# Patient Record
Sex: Male | Born: 1975
Health system: Southern US, Community
[De-identification: ages and names within clinical notes are randomized; demographics above are authoritative.]

## PROBLEM LIST (undated history)

## (undated) DIAGNOSIS — B1910 Unspecified viral hepatitis B without hepatic coma: Secondary | ICD-10-CM

## (undated) DIAGNOSIS — F419 Anxiety disorder, unspecified: Secondary | ICD-10-CM

## (undated) DIAGNOSIS — F32A Depression, unspecified: Secondary | ICD-10-CM

## (undated) HISTORY — DX: Unspecified viral hepatitis B without hepatic coma: B19.10

## (undated) HISTORY — DX: Anxiety disorder, unspecified: F41.9

## (undated) HISTORY — PX: NO PAST SURGERIES: SHX2092

## (undated) HISTORY — DX: Depression, unspecified: F32.A

---

## 2003-08-12 ENCOUNTER — Emergency Department (HOSPITAL_COMMUNITY): Admission: EM | Admit: 2003-08-12 | Discharge: 2003-08-12 | Payer: Self-pay | Admitting: Emergency Medicine

## 2004-06-02 ENCOUNTER — Ambulatory Visit: Payer: Self-pay | Admitting: Internal Medicine

## 2004-06-16 ENCOUNTER — Ambulatory Visit: Payer: Self-pay | Admitting: Internal Medicine

## 2004-07-08 ENCOUNTER — Ambulatory Visit: Payer: Self-pay | Admitting: Internal Medicine

## 2004-08-07 ENCOUNTER — Ambulatory Visit: Payer: Self-pay | Admitting: Internal Medicine

## 2004-08-18 ENCOUNTER — Ambulatory Visit: Payer: Self-pay | Admitting: Internal Medicine

## 2006-04-01 ENCOUNTER — Emergency Department (HOSPITAL_COMMUNITY): Admission: EM | Admit: 2006-04-01 | Discharge: 2006-04-02 | Payer: Self-pay | Admitting: Emergency Medicine

## 2008-08-22 ENCOUNTER — Emergency Department (HOSPITAL_COMMUNITY): Admission: EM | Admit: 2008-08-22 | Discharge: 2008-08-22 | Payer: Self-pay | Admitting: Emergency Medicine

## 2009-02-18 ENCOUNTER — Ambulatory Visit: Payer: Self-pay | Admitting: Physician Assistant

## 2009-02-18 DIAGNOSIS — R634 Abnormal weight loss: Secondary | ICD-10-CM | POA: Insufficient documentation

## 2009-02-18 LAB — CONVERTED CEMR LAB
Amphetamine Screen, Ur: NEGATIVE
Bilirubin Urine: NEGATIVE
Cocaine Metabolites: NEGATIVE
Creatinine,U: 22.9 mg/dL
Glucose, Urine, Semiquant: NEGATIVE
Ketones, urine, test strip: NEGATIVE
Opiate Screen, Urine: NEGATIVE
Phencyclidine (PCP): NEGATIVE
Protein, U semiquant: NEGATIVE
Specific Gravity, Urine: 1.01
Urobilinogen, UA: 0.2

## 2009-02-21 ENCOUNTER — Encounter: Payer: Self-pay | Admitting: Physician Assistant

## 2009-02-21 LAB — CONVERTED CEMR LAB
ALT: 13 units/L (ref 0–53)
AST: 16 units/L (ref 0–37)
Alkaline Phosphatase: 73 units/L (ref 39–117)
Basophils Absolute: 0 10*3/uL (ref 0.0–0.1)
Basophils Relative: 0 % (ref 0–1)
HCV Ab: NEGATIVE
Hep A Total Ab: NEGATIVE
Hep B Core Total Ab: POSITIVE — AB
Hep B E Ab: POSITIVE — AB
Lymphocytes Relative: 20 % (ref 12–46)
MCHC: 32.1 g/dL (ref 30.0–36.0)
Neutro Abs: 4.5 10*3/uL (ref 1.7–7.7)
Neutrophils Relative %: 65 % (ref 43–77)
RBC: 5.15 M/uL (ref 4.22–5.81)
RDW: 13.8 % (ref 11.5–15.5)
Sodium: 142 meq/L (ref 135–145)
Total Bilirubin: 0.6 mg/dL (ref 0.3–1.2)
Total Protein: 7.6 g/dL (ref 6.0–8.3)

## 2009-05-11 ENCOUNTER — Emergency Department (HOSPITAL_COMMUNITY): Admission: EM | Admit: 2009-05-11 | Discharge: 2009-05-11 | Payer: Self-pay | Admitting: Emergency Medicine

## 2009-05-16 ENCOUNTER — Ambulatory Visit (HOSPITAL_COMMUNITY): Admission: RE | Admit: 2009-05-16 | Discharge: 2009-05-16 | Payer: Self-pay | Admitting: Physician Assistant

## 2009-05-16 ENCOUNTER — Encounter: Payer: Self-pay | Admitting: Physician Assistant

## 2010-04-29 ENCOUNTER — Emergency Department (HOSPITAL_COMMUNITY)
Admission: EM | Admit: 2010-04-29 | Discharge: 2010-04-29 | Payer: Self-pay | Source: Home / Self Care | Admitting: Emergency Medicine

## 2012-08-09 ENCOUNTER — Emergency Department (INDEPENDENT_AMBULATORY_CARE_PROVIDER_SITE_OTHER)
Admission: EM | Admit: 2012-08-09 | Discharge: 2012-08-09 | Disposition: A | Discharge status: Home / Self Care | Payer: Self-pay | Source: Home / Self Care | Attending: Emergency Medicine | Admitting: Emergency Medicine

## 2012-08-09 ENCOUNTER — Encounter (HOSPITAL_COMMUNITY): Payer: Self-pay | Admitting: *Deleted

## 2012-08-09 DIAGNOSIS — R51 Headache: Secondary | ICD-10-CM

## 2012-08-09 LAB — POCT I-STAT, CHEM 8
BUN: 14 mg/dL (ref 6–23)
Chloride: 102 mEq/L (ref 96–112)
Creatinine, Ser: 0.9 mg/dL (ref 0.50–1.35)
Glucose, Bld: 99 mg/dL (ref 70–99)
HCT: 43 % (ref 39.0–52.0)
Potassium: 3.7 mEq/L (ref 3.5–5.1)

## 2012-08-09 MED ORDER — ACETAMINOPHEN-CODEINE #3 300-30 MG PO TABS: 1.0000 | ORAL_TABLET | Freq: Four times a day (QID) | ORAL | Status: DC | PRN

## 2012-08-09 NOTE — ED Notes (Signed)
Pt reports that he had nausea, vomiting, diarrhea and headaches. States that all symptoms are resolved except for the headache. Pt appears to be in no acute distress.

## 2012-08-09 NOTE — ED Provider Notes (Signed)
History     CSN: [id number]  Arrival date & time 08/09/12  1509   First MD Initiated Contact with Patient 08/09/12 1530      Chief Complaint  Patient presents with  . Headache    (Consider location/radiation/quality/duration/timing/severity/associated sxs/prior treatment) HPI Comments: Patient presents urgent care reporting that for the last 3 days he's been having a " stomach bug", he reports he had nausea vomiting and several episodes diarrheas. Associated with headaches. Might have had some tactile fevers. At this point he is no longer vomiting or having any diarrheas since yesterday. Patient works at [employer] and describes that he he came in as his headache has not away completely. Patient denies any further symptoms such as visual changes, numbness tingling sensation or weakness of any upper lower extremities. Patient is ambulating fine. Have not really tried anything specifically for his headache so far.  Patient is a 37 y.o. male presenting with headaches. The history is provided by the patient.  Headache Pain location:  Generalized Quality:  Dull Radiates to:  Does not radiate Severity currently:  5/10 Onset quality:  Gradual Duration:  3 days Progression:  Waxing and waning Similar to prior headaches: no   Context: activity and loud noise   Associated symptoms: diarrhea, nausea and vomiting   Associated symptoms: no abdominal pain, no back pain, no blurred vision, no cough, no dizziness, no fever, no hearing loss, no neck pain, no numbness, no photophobia, no seizures, no sinus pressure and no tingling     History reviewed. No pertinent past medical history.  History reviewed. No pertinent past surgical history.  Family History  Problem Relation Age of Onset  . Family history unknown: Yes    History  Substance Use Topics  . Smoking status: Never Smoker   . Smokeless tobacco: Not on file  . Alcohol Use: No      Review of Systems  Constitutional: Positive for  appetite change. Negative for fever and activity change.  HENT: Negative for hearing loss, neck pain and sinus pressure.   Eyes: Negative for blurred vision and photophobia.  Respiratory: Negative for cough and shortness of breath.   Cardiovascular: Negative for chest pain and palpitations.  Gastrointestinal: Positive for nausea, vomiting and diarrhea. Negative for abdominal pain, constipation, blood in stool and anal bleeding.  Musculoskeletal: Negative for back pain.  Skin: Negative for color change, pallor and rash.  Neurological: Positive for headaches. Negative for dizziness, seizures, facial asymmetry, weakness and numbness.    Allergies  Review of patient's allergies indicates no known allergies.  Home Medications   Current Outpatient Rx  Name  Route  Sig  Dispense  Refill  . acetaminophen-codeine (TYLENOL #3) 300-30 MG per tablet   Oral   Take 1-2 tablets by mouth every 6 (six) hours as needed for pain.   15 tablet   0     BP 131/80  Pulse 76  Temp(Src) 98.2 F (36.8 C) (Oral)  Resp 18  SpO2 98%  Physical Exam  Nursing note and vitals reviewed. Constitutional: He is oriented to person, place, and time. Vital signs are normal. He appears well-developed and well-nourished.  Non-toxic appearance. He does not have a sickly appearance. He does not appear ill. No distress.  Eyes: Conjunctivae are normal. No scleral icterus.  Neck: Neck supple. No JVD present.  Cardiovascular: Normal rate.  Exam reveals no gallop and no friction rub.   No murmur heard. Pulmonary/Chest: Effort normal.  Neurological: He is alert and oriented to  person, place, and time. He has normal strength. He displays normal reflexes. No cranial nerve deficit or sensory deficit. He exhibits normal muscle tone. He displays a negative Romberg sign. Coordination and gait normal.  Skin: No rash noted. No erythema.    ED Course  Procedures (including critical care time)  Labs Reviewed  POCT I-STAT, CHEM  8   No results found.   1. Headache       MDM  Besides patient being underweight physical exam was unremarkable most specific a focus neurological exam. Patient is comfortable and smiling during exam. Have checked electrolytes no abnormalities. Encourage patient to take Tylenol No. 3 to drink abundant fluids and discussed symptoms that should warrant further evaluation in the emergency department otherwise have recommended that if his headache continues to followup with his primary care doctor in 3-5 days.        Jimmie Molly, MD 08/09/12 [phone]

## 2018-02-03 ENCOUNTER — Ambulatory Visit: Payer: Self-pay | Admitting: Family Medicine

## 2018-02-22 ENCOUNTER — Encounter: Payer: Self-pay | Admitting: Family Medicine

## 2018-02-22 ENCOUNTER — Ambulatory Visit: Payer: Self-pay | Attending: Family Medicine | Admitting: Family Medicine

## 2018-02-22 VITALS — BP 125/81 | HR 64 | Temp 98.3°F | Resp 18 | Ht 72.0 in | Wt 135.0 lb

## 2018-02-22 DIAGNOSIS — J329 Chronic sinusitis, unspecified: Secondary | ICD-10-CM | POA: Insufficient documentation

## 2018-02-22 DIAGNOSIS — B1910 Unspecified viral hepatitis B without hepatic coma: Secondary | ICD-10-CM

## 2018-02-22 DIAGNOSIS — H6123 Impacted cerumen, bilateral: Secondary | ICD-10-CM

## 2018-02-22 DIAGNOSIS — R634 Abnormal weight loss: Secondary | ICD-10-CM

## 2018-02-22 DIAGNOSIS — J019 Acute sinusitis, unspecified: Secondary | ICD-10-CM

## 2018-02-22 DIAGNOSIS — Z23 Encounter for immunization: Secondary | ICD-10-CM

## 2018-02-22 DIAGNOSIS — H9193 Unspecified hearing loss, bilateral: Secondary | ICD-10-CM

## 2018-02-22 MED ORDER — AMOXICILLIN 500 MG PO CAPS: 500.0000 mg | ORAL_CAPSULE | Freq: Two times a day (BID) | ORAL | 0 refills | Status: DC

## 2018-02-22 MED FILL — AMOXICILLIN 500 MG CAPSULE: 500 | 10 days supply | Qty: 20 | Fill #0

## 2018-02-22 NOTE — Patient Instructions (Signed)
Hepatitis B  Hepatitis B is a liver infection. It is caused by a germ. This germ is passed from person to person through:  · Blood.  · Birth.  · Sex.  · Body fluids, like breast milk, tears, and spit (saliva).    Follow these instructions at home:  · Rest when you feel tired.  · Avoid alcohol.  · Take medicines only as told by your doctor.  · Do not take any medicine unless your doctor says it is okay. This includes fever or pain medicine.  · Do not have sex unless your doctor says it is okay.  · Do not share toothbrushes, nail clippers, razors, or needles with others.  Get help right away if:  · You are not able to eat or drink.  · You have a fever and feel sick to your stomach (nauseous) or throw up (vomit).  · You feel confused.  · Your skin or the whites of your eyes look yellow (jaundice).  · You have trouble breathing.  · You get a rash.  · Your skin, throat, mouth, or face becomes puffy (swollen).  · You start twitching or shaking (seizure).  · You are very sleepy and have trouble waking up.  This information is not intended to replace advice given to you by your health care provider. Make sure you discuss any questions you have with your health care provider.  Document Released: 09/20/2008 Document Revised: 10/10/2015 Document Reviewed: 08/11/2013  Elsevier Interactive Patient Education © 2017 [employer]

## 2018-02-22 NOTE — Progress Notes (Signed)
Subjective:    Patient ID: Brent [name], male    DOB: [birth year], 42 y.o.   MRN: [id number]  HPI 42 year old male new to the practice.  Patient is accompanied by his aunt at today's visit.  Patient's aunt states that normally patient's father would be with the patient but his father was not feeling well today.  Aunt states that the patient is currently living with her as he is trying to find a job within walking distance of her house. Patient's aunt feels that the patient has been losing weight over the past 6 months despite having a good appetite. She also feels that patient does not always seem to hear her when she is talking with him. Aunt states that she lived and worked in [address] so she is not sure of all of patient's medical history but believes that she was told in the past that he had issues with his liver. Aunt has also noticed that patient at times recently has had a bad odor to his breath. Aunt states that patient never reports that he is having any issues.       Patient, upon questioning, denies any abdominal pain, no muscle or joint pain, no headaches or dizziness, no ear pain, and denies sore throat.  Patient denies any recent fever or chills, no shortness of breath or cough, no chest pain or palpitations.  Patient denies dysuria or urinary frequency.  Patient reports no drug allergies which is confirmed by his aunt.  Patient and aunt deny any past surgical history.  Per aunt, patient has a maternal great-grandmother who had a stroke at the age of 22 and a maternal great grandfather who had hypertension.  Patient does not smoke or drink alcohol.  Patient did graduate high school.  Patient is single and has no children.    Review of Systems  Constitutional: Negative for chills and fever.  HENT: Positive for hearing loss (aunt believes that patient does not hear as well as he should). Negative for ear pain and trouble swallowing.   Respiratory: Negative for cough and  shortness of breath.   Cardiovascular: Negative for chest pain and leg swelling.  Gastrointestinal: Negative for abdominal pain and constipation.  Genitourinary: Negative for dysuria and frequency.  Musculoskeletal: Negative for back pain, gait problem and joint swelling.  Neurological: Negative for dizziness and headaches.       Objective:   Physical Exam BP 125/81 (BP Location: Left Arm, Patient Position: Sitting, Cuff Size: Normal)   Pulse 64   Temp 98.3 F (36.8 C) (Oral)   Resp 18   Ht 6' (1.829 m)   Wt 135 lb (61.2 kg)   SpO2 96%   BMI 18.31 kg/m nurse's notes and vital signs reviewed General-well-nourished well-developed male who appears younger than stated age.  Patient is accompanied by his on at today's visit ENT- patient's TMs are obscured bilaterally by large amounts of soft appearing earwax.  Patient's earwax was removed by CMA via ear lavage.  Patient had complete removal of earwax from the left ear canal.  Patient's left TM was slightly pink but with visible landmarks.  Ear lavage had to be discontinued on the right ear secondary to patient with onset of dizziness.  Visualized portion of the TM was dull but patient still with significant amount of wax within the ear canal.  Patient with edema/erythema of the nasal turbinates and thick yellow nasal discharge, patient with mild posterior pharynx erythema Neck-[name], no lymphadenopathy, no thyromegaly  Cardiovascular- regular rate and rhythm Lungs-clear to auscultation bilaterally Abdomen-soft, nontender, patient did appear to have some fullness in the right upper quadrant with palpation but denied any discomfort Back-no CVA tenderness, patient did have some mild cervicothoracic scoliosis Extremities-no edema Psych- patient with a pleasant demeanor but did not really speak unless spoken to throughout his visit. Patient appears to have an intellectual disability     Assessment & Plan:  1. Subacute sinusitis, unspecified  location Patient with evidence of sinusitis on examination.  Patient is on has noted that patient has had a recent bad odor to his breath and I discussed with the aunt that this may be the source.  Prescription provided for amoxicillin 500 mg twice daily x10 days - amoxicillin (AMOXIL) 500 MG capsule; Take 1 capsule (500 mg total) by mouth 2 (two) times daily.  Dispense: 20 capsule; Refill: 0  2. Weight loss, unintentional Patient's aunt feels that despite having a good appetite, patient has had weight loss over the past 6 months.  On review of patient's chart, patient was seen at [facility] in 2010 for similar complaint and patient had hepatitis B panel which was positive.  Patient will have CMP and TSH done in follow-up of possible weight loss as well as GI referral. - Ambulatory referral to Gastroenterology - Comprehensive metabolic panel - TSH + free T4  3. Hepatitis B infection without delta agent without hepatic coma, unspecified chronicity On review of chart, patient has had a hepatitis B panel which was positive.  Patient will have CMP to check liver function test and will also be referred to GI for further evaluation and treatment - Ambulatory referral to Gastroenterology - Comprehensive metabolic panel  4. Bilateral impacted cerumen Patient with bilateral cerumen impaction on exam and CMA was able to remove all of the cerumen in the left ear via ear lavage and a portion of the wax in the right ear.  Patient did report improvement in hearing status post ear lavage.  Sample and coupon provided for Debrox for home use to help dissolve remaining wax.  5. Decreased hearing of both ears Patient's aunt had noted that patient seemed to have some difficulty with hearing and patient was found to have a bilateral cerumen impaction on exam and patient with improvement in hearing s/p removal of earwax.  6. Need for immunization against influenza Patient was offered and agreed to have influenza  immunization at today's visit  An After Visit Summary was printed and given to the patient.  Return in about 8 weeks (around 04/19/2018) for weight loss.

## 2018-02-23 LAB — COMPREHENSIVE METABOLIC PANEL WITH GFR
ALT: 19 IU/L (ref 0–44)
AST: 19 IU/L (ref 0–40)
Albumin/Globulin Ratio: 1.5 (ref 1.2–2.2)
Albumin: 4.6 g/dL (ref 3.5–5.5)
Alkaline Phosphatase: 77 IU/L (ref 39–117)
BUN/Creatinine Ratio: 21 — ABNORMAL HIGH (ref 9–20)
BUN: 18 mg/dL (ref 6–24)
Bilirubin Total: 0.4 mg/dL (ref 0.0–1.2)
CO2: 22 mmol/L (ref 20–29)
Calcium: 9.5 mg/dL (ref 8.7–10.2)
Chloride: 101 mmol/L (ref 96–106)
Creatinine, Ser: 0.85 mg/dL (ref 0.76–1.27)
GFR calc Af Amer: 126 mL/min/1.73
GFR calc non Af Amer: 109 mL/min/1.73
Globulin, Total: 3 g/dL (ref 1.5–4.5)
Glucose: 94 mg/dL (ref 65–99)
Potassium: 4.1 mmol/L (ref 3.5–5.2)
Sodium: 141 mmol/L (ref 134–144)
Total Protein: 7.6 g/dL (ref 6.0–8.5)

## 2018-02-23 LAB — TSH+FREE T4
Free T4: 1.34 ng/dL (ref 0.82–1.77)
TSH: 1.24 u[IU]/mL (ref 0.450–4.500)

## 2018-02-25 ENCOUNTER — Telehealth: Payer: Self-pay

## 2018-02-25 NOTE — Telephone Encounter (Signed)
Patient returned the call for his lab results. please follow up with patient.

## 2018-02-25 NOTE — Telephone Encounter (Signed)
Patient was called, aunt answered the phone and stated she would relay message to return call. If the patient returns the call  Please notify patient of normal CMP and normal thyroid blood work

## 2018-02-25 NOTE — Telephone Encounter (Signed)
-----   Message from Cain Saupe, MD sent at 02/25/2018  2:01 PM EDT ----- Please notify patient of normal CMP and normal thyroid blood work

## 2018-03-02 ENCOUNTER — Encounter: Payer: Self-pay | Admitting: Gastroenterology

## 2018-03-31 ENCOUNTER — Encounter: Payer: Self-pay | Admitting: Gastroenterology

## 2018-03-31 ENCOUNTER — Other Ambulatory Visit (INDEPENDENT_AMBULATORY_CARE_PROVIDER_SITE_OTHER): Payer: Self-pay

## 2018-03-31 ENCOUNTER — Telehealth: Payer: Self-pay

## 2018-03-31 ENCOUNTER — Ambulatory Visit (INDEPENDENT_AMBULATORY_CARE_PROVIDER_SITE_OTHER): Payer: Self-pay | Admitting: Gastroenterology

## 2018-03-31 ENCOUNTER — Encounter (INDEPENDENT_AMBULATORY_CARE_PROVIDER_SITE_OTHER): Payer: Self-pay

## 2018-03-31 VITALS — BP 110/80 | HR 74 | Ht 72.0 in | Wt 139.0 lb

## 2018-03-31 DIAGNOSIS — K225 Diverticulum of esophagus, acquired: Secondary | ICD-10-CM

## 2018-03-31 DIAGNOSIS — R196 Halitosis: Secondary | ICD-10-CM

## 2018-03-31 DIAGNOSIS — R634 Abnormal weight loss: Secondary | ICD-10-CM

## 2018-03-31 DIAGNOSIS — K219 Gastro-esophageal reflux disease without esophagitis: Secondary | ICD-10-CM

## 2018-03-31 LAB — H. PYLORI ANTIBODY, IGG: H PYLORI IGG: NEGATIVE

## 2018-03-31 LAB — TSH: TSH: 0.88 u[IU]/mL (ref 0.35–4.50)

## 2018-03-31 MED ORDER — PANTOPRAZOLE SODIUM 40 MG PO TBEC: 40.0000 mg | DELAYED_RELEASE_TABLET | Freq: Two times a day (BID) | ORAL | 1 refills | Status: DC

## 2018-03-31 NOTE — Patient Instructions (Addendum)
Your provider has requested that you go to the basement level for lab work before leaving today. Press "B" on the elevator. The lab is located at the first door on the left as you exit the elevator.   We have sent the following medications to your pharmacy for you to pick up at your convenience: Do not start the Pantoprazole until you have turned in the stool specimen  Follow up appointment with Dr. Tarri Glenn on 04/19/18 11:00  You have been scheduled for an Upper GI Series and Small Bowel Follow Thru at 04/15/18. Your appointment is on 04/15/18 at 11:00. Please arrive 15 minutes prior to your test for registration. Make certain not to have anything to eat or drink after midnight on the night before your test. If you need to reschedule, please contact radiology at [phone]. --------------------------------------------------------------------------------------------------------------- An upper GI series uses x rays to help diagnose problems of the upper GI tract, which includes the esophagus, stomach, and duodenum. The duodenum is the first part of the small intestine. An upper GI series is conducted by a radiology technologist or a radiologist-a doctor who specializes in x-ray imaging-at a hospital or outpatient center. While sitting or standing in front of an x-ray machine, the patient drinks barium liquid, which is often white and has a chalky consistency and taste. The barium liquid coats the lining of the upper GI tract and makes signs of disease show up more clearly on x rays. X-ray video, called fluoroscopy, is used to view the barium liquid moving through the esophagus, stomach, and duodenum. Additional x rays and fluoroscopy are performed while the patient lies on an x-ray table. To fully coat the upper GI tract with barium liquid, the technologist or radiologist may press on the abdomen or ask the patient to change position. Patients hold still in various positions, allowing the technologist or  radiologist to take x rays of the upper GI tract at different angles. If a technologist conducts the upper GI series, a radiologist will later examine the images to look for problems.  This test typically takes about 1 hour to complete --------------------------------------------------------------------------------------------------------------------------------------------- The Small Bowel Follow Thru examination is used to visualize the entire small bowel (intestines); specifically the connection between the small and large intestine. You will be positioned on a flat x-ray table and an image of your abdomen taken. Then the technologist will show the x-ray to the radiologist. The radiologist will instruct your technologist how much (1-2 cups) barium sulfate you will drink and when to begin taking the timed x-rays, usually 15-30 minutes after you begin drinking. Barium is a harmless substance that will highlight your small intestine by absorbing x-ray. The taste is chalky and it feels very heavy both in the cup and in your stomach.  After the first x-ray is taken and shown to the radiologist, he/she will determine when the next image is to be taken. This is repeated until the barium has reached the end of the small intestine and enters the beginning of the colon (cecum). At such time when the barium spills into the colon, you will be positioned on the x-ray table once again. The radiologist will use a fluoroscopic camera to take some detailed pictures of the connection between your small intestine and colon. The fluoroscope is an x-ray unit that works with a television/computer screen. The radiologist will apply pressure to your abdomen with his/her hand and a lead glove, a plastic paddle, or a paddle with an inflated rubber balloon on the end.  This is to spread apart your loops of intestine so he/she can see all areas.   This test typically takes around 1 hour to complete.  Important Drink plenty of water  (8-10 cups/day) for a few days following the procedure to avoid constipation and blockage. The barium will make your stools white for a few days. --------------------------------------------------------------------------------------------------------------------------------------------   You have been scheduled for a CT scan of the abdomen and pelvis at [place] are scheduled on 04/05/18 at 2:30 pm. You should arrive 15 minutes prior to your appointment time for registration. Please follow the written instructions below on the day of your exam:  WARNING: IF YOU ARE ALLERGIC TO IODINE/X-RAY DYE, PLEASE NOTIFY RADIOLOGY IMMEDIATELY AT [phone]! YOU WILL BE GIVEN A 13 HOUR PREMEDICATION PREP.  1) Do not eat or drink anything after 2:30 (4 hours prior to your test) 2) You have been given 2 bottles of oral contrast to drink. The solution may taste better if refrigerated, but do NOT add ice or any other liquid to this solution. Shake well before drinking.    Drink 1 bottle of contrast @ 12:30 pm (2 hours prior to your exam)  Drink 1 bottle of contrast @ 1:30 pm (1 hour prior to your exam)  You may take any medications as prescribed with a small amount of water, if necessary. If you take any of the following medications: METFORMIN, GLUCOPHAGE, GLUCOVANCE, AVANDAMET, RIOMET, FORTAMET, [place] MET, JANUMET, GLUMETZA or METAGLIP, you MAY be asked to HOLD this medication 48 hours AFTER the exam.  The purpose of you drinking the oral contrast is to aid in the visualization of your intestinal tract. The contrast solution may cause some diarrhea. Depending on your individual set of symptoms, you may also receive an intravenous injection of x-ray contrast/dye. Plan on being at [facility] for 30 minutes or longer, depending on the type of exam you are having performed.  This test typically takes 30-45 minutes to complete.  If you have any questions regarding your exam or if you  need to reschedule, you may call the CT department at [phone] between the hours of 8:00 am and 5:00 pm, Monday-Friday.  ________________________________________________________________________          If you are age 83 or older, your body mass index should be between 23-30. Your Body mass index is 18.85 kg/m. If this is out of the aforementioned range listed, please consider follow up with your Primary Care Provider.  If you are age 4 or younger, your body mass index should be between 19-25. Your Body mass index is 18.85 kg/m. If this is out of the aformentioned range listed, please consider follow up with your Primary Care Provider.

## 2018-03-31 NOTE — Telephone Encounter (Signed)
Left message for patient to call back for results.  

## 2018-03-31 NOTE — Progress Notes (Signed)
Referring Provider: Cain Saupe, MD Primary Care Physician:  Cain Saupe, MD   Reason for Consultation:  Weight loss, hepatitis B   IMPRESSION:  Unintentional weight loss Halitosis Past HBV infection    - HBcAb+. HBsAb+. HBeAb+. HBsAg-.     - HCV Ab negative Platelets of 174,000  Extent of weight loss is unclear. Will work to obtain some objective evidence. He is completely asymptomatic and is, himself, not bothered by the halitosis.   Gastrointestinal causes of halitosis include Zenker's diverticulum, H pylori, gastroesophageal reflux disease and the rare diagnosis of gastrocolic fistulae.  Given this differential I am recommending an upper GI series. However, non-GI etiologies should also be considered. I encouraged he and his aunt to follow-up closely with Dr. Jillyn Hidden.   I have reviewed his Hepatitis B labs from 2010. They show resolved infection. No additional lab evaluation necessary at this time.  PLAN:  UGI series to evaluate for GERD and Zenker's diverticulum CT chest/abd/pelvis with contrast to evaluate for weight loss H pylori stool antibody test TSH Consider repeating a CBC if this was not done by Dr. Jillyn Hidden. Pantoprazole 40 mg BID x 8 weeks Obtain records from [employer] regarding prior weights Return to the clinic 1-2 weeks after the CT scan  HPI: Brent [name] is a 42 y.o. male seen in consultation at the request of Dr. Jillyn Hidden for further evaluation of weight loss and hepatitis B.  The history is obtained through the patient, his aunt who accompanies him to this appointment, and review of his electronic health record. Recently moved in with his aunt after his grandparents who had been his caregivers. Previously followed a health serve.  Referred for weight loss. Good appetite. Eating well. He has needed to get smaller pants. Unable to identify the time frame of weight loss.  Unable to quantitate his weight loss.  There is no dysphagia, odynophagia, regurgitation,   heartburn, nausea, abdominal pain, change in bowel habits, melena, hematochezia, or bright red blood per rectum. There is no anorexia.  His aunt accompanies him to this appointment and is worried about halitosis. She is concerned that he has some undiagnosed health issues because he doesn't appear well. She notes that he was diagnosed with hepatitis B in 2005. She believes he is extremely skinny compared to his baseline. She notes that he eats well and is worried that his weight continues to decline.   Labs from 02/18/2009 show a hep B core antibody positive, hep B surface antibody positive, and hep B E antibody positive.  Hep B surface antigen was negative.  HCV antibody was negative   A comprehensive metabolic panel 02/22/2018 was normal.  CBC was normal 02/18/2009 with a white count 6.9, hemoglobin 14.8, platelets 174.     No past medical history on file.  No past surgical history on file.  Current Outpatient Medications  Medication Sig Dispense Refill  . amoxicillin (AMOXIL) 500 MG capsule Take 1 capsule (500 mg total) by mouth 2 (two) times daily. 20 capsule 0   No current facility-administered medications for this visit.     Allergies as of 03/31/2018  . (No Known Allergies)    Family History  Family history unknown: Yes    Social History   Socioeconomic History  . Marital status: Single    Spouse name: Not on file  . Number of children: Not on file  . Years of education: Not on file  . Highest education level: Not on file  Occupational History  .  Not on file  Social Needs  . Financial resource strain: Not on file  . Food insecurity:    Worry: Not on file    Inability: Not on file  . Transportation needs:    Medical: Not on file    Non-medical: Not on file  Tobacco Use  . Smoking status: Never Smoker  . Smokeless tobacco: Never Used  Substance and Sexual Activity  . Alcohol use: No  . Drug use: No  . Sexual activity: Never  Lifestyle  . Physical activity:     Days per week: Not on file    Minutes per session: Not on file  . Stress: Not on file  Relationships  . Social connections:    Talks on phone: Not on file    Gets together: Not on file    Attends religious service: Not on file    Active member of club or organization: Not on file    Attends meetings of clubs or organizations: Not on file    Relationship status: Not on file  . Intimate partner violence:    Fear of current or ex partner: Not on file    Emotionally abused: Not on file    Physically abused: Not on file    Forced sexual activity: Not on file  Other Topics Concern  . Not on file  Social History Narrative  . Not on file    Review of Systems: 12 system ROS is negative except as noted above.  There were no vitals filed for this visit.  Physical Exam: Vital signs were reviewed. General:   Alert, well-nourished, pleasant and cooperative in NAD Head:  Normocephalic and atraumatic. Eyes:  Sclera clear, no icterus.   Conjunctiva pink. Mouth:  No deformity or lesions.   Neck:  Supple; no thyromegaly. Lungs:  Clear throughout to auscultation.   No wheezes.  Heart:  Regular rate and rhythm; no murmurs Abdomen:  Soft, nontender, normal bowel sounds. No rebound or guarding. No hepatosplenomegaly Rectal:  Deferred  Msk:  Symmetrical without gross deformities. Extremities:  No gross deformities or edema. Neurologic:  Alert and  oriented x4;  grossly nonfocal Skin:  No rash or bruise. Psych:  Alert and cooperative. Normal mood and affect.   Heath Tesler L. Orvan FalconerBeavers, Md, MPH [facility] 03/31/2018, 8:10 AM

## 2018-04-05 ENCOUNTER — Ambulatory Visit (HOSPITAL_COMMUNITY)
Admission: RE | Admit: 2018-04-05 | Discharge: 2018-04-05 | Disposition: A | Discharge status: Home / Self Care | Payer: Self-pay | Source: Ambulatory Visit | Attending: Gastroenterology | Admitting: Gastroenterology

## 2018-04-05 DIAGNOSIS — R634 Abnormal weight loss: Secondary | ICD-10-CM | POA: Insufficient documentation

## 2018-04-05 MED ORDER — SODIUM CHLORIDE (PF) 0.9 % IJ SOLN
INTRAMUSCULAR | Status: AC
  Filled 2018-04-05: qty 50

## 2018-04-05 MED ORDER — IOHEXOL 300 MG/ML  SOLN
100.0000 mL | Freq: Once | INTRAMUSCULAR | Status: AC | PRN
  Administered 2018-04-05: 100 mL via INTRAVENOUS

## 2018-04-05 MED ORDER — IOPAMIDOL (ISOVUE-300) INJECTION 61%: 100.0000 mL | Freq: Once | INTRAVENOUS | Status: DC | PRN

## 2018-04-08 ENCOUNTER — Telehealth: Payer: Self-pay | Admitting: Gastroenterology

## 2018-04-08 NOTE — Telephone Encounter (Signed)
Patient family member calling nurse back to get results from CT done on 11.19.19.

## 2018-04-08 NOTE — Telephone Encounter (Signed)
Patient's aunt advised of results and recommendations. She will pass this information onto him.

## 2018-04-15 ENCOUNTER — Inpatient Hospital Stay (HOSPITAL_COMMUNITY): Admission: RE | Admit: 2018-04-15 | Payer: Self-pay | Source: Ambulatory Visit

## 2018-04-18 NOTE — Progress Notes (Deleted)
Referring Provider: Cain Saupe, MD Primary Care Physician:  Cain Saupe, MD   Reason for Consultation:  ***   IMPRESSION:  Unintentional weight loss    - no source of identified on CT of chest/abd/pelvis    - TSH normal Halitosis    - H pylori negative Past HBV infection    - HBcAb+. HBsAb+. HBeAb+. HBsAg-.     - HCV Ab negative Platelets of 174,000  Extent of weight loss is unclear. Will work to obtain some objective evidence. He is completely asymptomatic and is, himself, not bothered by the halitosis.   Gastrointestinal causes of halitosis include Zenker's diverticulum, H pylori, gastroesophageal reflux disease and the rare diagnosis of gastrocolic fistulae.  Given this differential I am recommending an upper GI series. However, non-GI etiologies should also be considered. I encouraged he and his aunt to follow-up closely with Dr. Jillyn Hidden.   I have reviewed his Hepatitis B labs from 2010. They show resolved infection. No additional lab evaluation necessary at this time.  PLAN: UGI series as previously planned   HPI: Brent [name] is a 42 y.o. male returns in follow-up after his initial consultation for unintentional weight loss and halitosis.  He is prescribed Protonix 40 mg twice daily at that time.  A CT of the abdomen and pelvis with contrast 04/05/2018 suggested a collapsed hiatal hernia versus thickening of the distal esophagus.  There is a 6 mm liver cyst.  There is a significant stool burden throughout the colon.  No significant intrathoracic, intra-abdominal, or intrapelvic abnormalities.  Labs showed a normal TSH.  Testing for H. pylori was negative.    Past Medical History:  Diagnosis Date  . Anxiety   . Hepatitis B     Past Surgical History:  Procedure Laterality Date  . NO PAST SURGERIES      Current Outpatient Medications  Medication Sig Dispense Refill  . pantoprazole (PROTONIX) 40 MG tablet Take 1 tablet (40 mg total) by mouth 2 (two) times  daily. 60 tablet 1   No current facility-administered medications for this visit.     Allergies as of 04/19/2018  . (No Known Allergies)    Family History  Problem Relation Age of Onset  . Ovarian cancer Paternal Grandmother   . Prostate cancer Paternal Grandfather     Social History   Socioeconomic History  . Marital status: Single    Spouse name: Not on file  . Number of children: 0  . Years of education: Not on file  . Highest education level: Not on file  Occupational History  . Occupation: unemployed  Social Needs  . Financial resource strain: Not on file  . Food insecurity:    Worry: Not on file    Inability: Not on file  . Transportation needs:    Medical: Not on file    Non-medical: Not on file  Tobacco Use  . Smoking status: Never Smoker  . Smokeless tobacco: Never Used  Substance and Sexual Activity  . Alcohol use: No  . Drug use: No  . Sexual activity: Never  Lifestyle  . Physical activity:    Days per week: Not on file    Minutes per session: Not on file  . Stress: Not on file  Relationships  . Social connections:    Talks on phone: Not on file    Gets together: Not on file    Attends religious service: Not on file    Active member of club or organization: Not  on file    Attends meetings of clubs or organizations: Not on file    Relationship status: Not on file  . Intimate partner violence:    Fear of current or ex partner: Not on file    Emotionally abused: Not on file    Physically abused: Not on file    Forced sexual activity: Not on file  Other Topics Concern  . Not on file  Social History Narrative  . Not on file    Review of Systems: 12 system ROS is negative except as noted above.  There were no vitals filed for this visit.  Physical Exam: Vital signs were reviewed. Vital signs were reviewed. General:   Alert, well-nourished, pleasant and cooperative in NAD Head:  Normocephalic and atraumatic. Eyes:  Sclera clear, no icterus.    Conjunctiva pink. Mouth:  No deformity or lesions.   Neck:  Supple; no thyromegaly. Lungs:  Clear throughout to auscultation.   No wheezes.  Heart:  Regular rate and rhythm; no murmurs Abdomen:  Soft, nontender, normal bowel sounds. No rebound or guarding. No hepatosplenomegaly Rectal:  Deferred  Msk:  Symmetrical without gross deformities. Extremities:  No gross deformities or edema. Neurologic:  Alert and  oriented x4;  grossly nonfocal Skin:  No rash or bruise. Psych:  Alert and cooperative. Normal mood and affect.  Ragena Fiola L. Orvan FalconerBeavers, MD, MPH [facility] 04/18/2018, 5:04 PM

## 2018-04-19 ENCOUNTER — Ambulatory Visit: Payer: Self-pay | Admitting: Gastroenterology

## 2018-04-19 ENCOUNTER — Ambulatory Visit: Payer: Self-pay | Admitting: Family Medicine

## 2018-04-21 ENCOUNTER — Ambulatory Visit (HOSPITAL_COMMUNITY): Payer: Self-pay

## 2018-12-07 ENCOUNTER — Other Ambulatory Visit: Payer: Self-pay

## 2018-12-07 DIAGNOSIS — Z20822 Contact with and (suspected) exposure to covid-19: Secondary | ICD-10-CM

## 2018-12-09 LAB — NOVEL CORONAVIRUS, NAA: SARS-CoV-2, NAA: NOT DETECTED

## 2019-01-11 ENCOUNTER — Other Ambulatory Visit: Payer: Self-pay

## 2019-01-11 DIAGNOSIS — Z20822 Contact with and (suspected) exposure to covid-19: Secondary | ICD-10-CM

## 2019-01-12 LAB — NOVEL CORONAVIRUS, NAA: SARS-CoV-2, NAA: NOT DETECTED

## 2019-03-08 ENCOUNTER — Ambulatory Visit: Payer: Self-pay

## 2019-03-23 ENCOUNTER — Other Ambulatory Visit: Payer: Self-pay

## 2019-03-23 ENCOUNTER — Ambulatory Visit: Payer: Self-pay | Admitting: Family Medicine

## 2019-04-07 ENCOUNTER — Other Ambulatory Visit: Payer: Self-pay

## 2019-04-07 DIAGNOSIS — Z20822 Contact with and (suspected) exposure to covid-19: Secondary | ICD-10-CM

## 2019-04-10 LAB — NOVEL CORONAVIRUS, NAA: SARS-CoV-2, NAA: NOT DETECTED

## 2019-04-27 ENCOUNTER — Ambulatory Visit: Payer: Self-pay | Attending: Family Medicine | Admitting: Family Medicine

## 2019-04-27 ENCOUNTER — Encounter: Payer: Self-pay | Admitting: Family Medicine

## 2019-04-27 ENCOUNTER — Other Ambulatory Visit: Payer: Self-pay

## 2019-04-27 VITALS — BP 133/90 | HR 87 | Temp 98.5°F | Ht 72.0 in | Wt 139.6 lb

## 2019-04-27 DIAGNOSIS — Z8042 Family history of malignant neoplasm of prostate: Secondary | ICD-10-CM

## 2019-04-27 DIAGNOSIS — L659 Nonscarring hair loss, unspecified: Secondary | ICD-10-CM

## 2019-04-27 DIAGNOSIS — R634 Abnormal weight loss: Secondary | ICD-10-CM

## 2019-04-27 LAB — POCT URINALYSIS DIPSTICK
Bilirubin, UA: NEGATIVE
Blood, UA: NEGATIVE
Glucose, UA: NEGATIVE
Ketones, UA: NEGATIVE
Leukocytes, UA: NEGATIVE
Nitrite, UA: NEGATIVE
Protein, UA: NEGATIVE
Spec Grav, UA: 1.015
Urobilinogen, UA: 2 U/dL — AB
pH, UA: 7.5

## 2019-04-27 LAB — POCT GLYCOSYLATED HEMOGLOBIN (HGB A1C): Hemoglobin A1C: 5.6 % (ref 4.0–5.6)

## 2019-04-27 LAB — GLUCOSE, POCT (MANUAL RESULT ENTRY): POC Glucose: 90 mg/dL (ref 70–99)

## 2019-04-27 NOTE — Patient Instructions (Signed)
Alopecia Areata, Adult  Alopecia areata is a condition that causes you to lose hair. You may lose hair on your scalp in patches. In some cases, you may lose all the hair on your scalp (alopecia totalis) or all the hair from your face and body (alopecia universalis). Alopecia areata is an autoimmune disease. This means that your body's defense system (immune system) mistakes normal parts of the body for germs or other things that can make you sick. When you have alopecia areata, the immune system attacks the hair follicles. Alopecia areata usually develops in childhood, but it can develop at any age. For some people, their hair grows back on its own and hair loss does not happen again. For others, their hair may fall out and grow back in cycles. The hair loss may last many years. Having this condition can be emotionally difficult, but it is not dangerous. What are the causes? The cause of this condition is not known. What increases the risk? This condition is more likely to develop in people who have:  A family history of alopecia.  A family history of another autoimmune disease, including type 1 diabetes and rheumatoid arthritis.  Asthma and allergies.  Down syndrome. What are the signs or symptoms? Round spots of patchy hair loss on the scalp is the main symptom of this condition. The spots may be mildly itchy. Other symptoms include:  Short dark hairs in the bald patches that are wider at the top (exclamation point hairs).  Dents, white spots, or lines in the fingernails or toenails.  Balding and body hair loss. This is rare. How is this diagnosed? This condition is diagnosed based on your symptoms and family history. Your health care provider will also check your scalp skin, teeth, and nails. Your health care provider may refer you to a specialist in hair and skin disorders (dermatologist). You may also have tests, including:  A hair pull test.  Blood tests or other screening tests  to check for autoimmune diseases, such as thyroid disease or diabetes.  Skin biopsy to confirm the diagnosis.  A procedure to examine the skin with a lighted magnifying instrument (dermoscopy). How is this treated? There is no cure for alopecia areata. Treatment is aimed at promoting the regrowth of hair and preventing the immune system from overreacting. No single treatment is right for all people with alopecia areata. It depends on the type of hair loss you have and how severe it is. Work with your health care provider to find the best treatment for you. Treatment may include:  Having regular checkups to make sure the condition is not getting worse (watchful waiting).  Steroid creams or pills for 6-8 weeks to stop the immune reaction and help hair to regrow more quickly.  Other topical medicines to alter the immune system response and support the hair growth cycle.  Steroid injections.  Therapy and counseling with a support group or therapist if you are having trouble coping with hair loss. Follow these instructions at home:  Learn as much as you can about your condition.  Apply topical creams only as told by your health care provider.  Take over-the-counter and prescription medicines only as told by your health care provider.  Consider getting a wig or products to make hair look fuller or to cover bald spots, if you feel uncomfortable with your appearance.  Get therapy or counseling if you are having a hard time coping with hair loss. Ask your health care provider to recommend   a counselor or support group.  Keep all follow-up visits as told by your health care provider. This is important. Contact a health care provider if:  Your hair loss gets worse, even with treatment.  You have new symptoms.  You are struggling emotionally. Summary  Alopecia areata is an autoimmune condition that makes your body's defense system (immune system) attack the hair follicles. This causes you  to lose hair.  Treatments may include regular checkups to make sure that the condition is not getting worse (watchful waiting), medicines, and steroid injections. This information is not intended to replace advice given to you by your health care provider. Make sure you discuss any questions you have with your health care provider. Document Released: 12/07/2003 Document Revised: 04/16/2017 Document Reviewed: 05/22/2016 Elsevier Patient Education  2020 Elsevier Inc.  

## 2019-04-27 NOTE — Progress Notes (Signed)
Pt. Is here to have PCP to look at his alopecia.  Pt. Stated he has been walking a lot, and thinks he lost weight.

## 2019-04-27 NOTE — Progress Notes (Signed)
Established Patient Office Visit  Subjective:  Patient ID: Brent [name], male    DOB: 28-Oct-1975  Age: 42 y.o. MRN: [id number]  CC:  Chief Complaint  Patient presents with  . Follow-up    HPI Brent [name], 43 year old male, who was last seen in the office on 02/22/2018, due to the complaint of abnormal weight loss, abnormal labs regarding Hep B and complaint of a bad odor to his breath for which he was referred to gastroenterology for further evaluation and treatment as well as treated for subacute sinusitis. He reports that he did follow-up with GI. He is no longer taking any medications.          He still feels as if he loses weight without changes in his diet. He does admit that he tends to walk a lot on a daily basis as he does not have other transportation. He denies any abdominal pain, no N/V/D/C and no loss of appetite. He has not noticed any recent issues with a bad odor to his breath. He feels well overall. He has noticed an area of hair loss about the size of a nickel on the left side of the head. He has not had any rash on this area of his scalp. His is able to cover the area with surrounding hair. He does not pull at his hair or rub the area of hair loss. He just happened to notice it one day. .   Past Medical History:  Diagnosis Date  . Anxiety   . Hepatitis B     Past Surgical History:  Procedure Laterality Date  . NO PAST SURGERIES      Family History  Problem Relation Age of Onset  . Ovarian cancer Paternal Grandmother   . Prostate cancer Paternal Grandfather     Social History   Socioeconomic History  . Marital status: Single    Spouse name: Not on file  . Number of children: 0  . Years of education: Not on file  . Highest education level: Not on file  Occupational History  . Occupation: unemployed  Tobacco Use  . Smoking status: Never Smoker  . Smokeless tobacco: Never Used  Substance and Sexual Activity  . Alcohol use: Yes  . Drug use: No  .  Sexual activity: Never  Other Topics Concern  . Not on file  Social History Narrative  . Not on file   Social Determinants of Health   Financial Resource Strain:   . Difficulty of Paying Living Expenses: Not on file  Food Insecurity:   . Worried About Charity fundraiser in the Last Year: Not on file  . Ran Out of Food in the Last Year: Not on file  Transportation Needs:   . Lack of Transportation (Medical): Not on file  . Lack of Transportation (Non-Medical): Not on file  Physical Activity:   . Days of Exercise per Week: Not on file  . Minutes of Exercise per Session: Not on file  Stress:   . Feeling of Stress : Not on file  Social Connections:   . Frequency of Communication with Friends and Family: Not on file  . Frequency of Social Gatherings with Friends and Family: Not on file  . Attends Religious Services: Not on file  . Active Member of Clubs or Organizations: Not on file  . Attends Archivist Meetings: Not on file  . Marital Status: Not on file  Intimate Partner Violence:   . Fear of  Current or Ex-Partner: Not on file  . Emotionally Abused: Not on file  . Physically Abused: Not on file  . Sexually Abused: Not on file    Outpatient Medications Prior to Visit  Medication Sig Dispense Refill  . pantoprazole (PROTONIX) 40 MG tablet Take 1 tablet (40 mg total) by mouth 2 (two) times daily. (Patient not taking: Reported on 04/27/2019) 60 tablet 1   No facility-administered medications prior to visit.    No Known Allergies  ROS Review of Systems  Constitutional: Positive for fatigue and unexpected weight change. Negative for diaphoresis and fever.  HENT: Negative for ear pain, nosebleeds, sore throat and trouble swallowing.   Eyes: Negative for photophobia and visual disturbance.  Respiratory: Negative for cough and shortness of breath.   Cardiovascular: Negative for chest pain, palpitations and leg swelling.  Gastrointestinal: Negative for abdominal  pain, blood in stool, constipation, diarrhea and nausea.  Endocrine: Negative for cold intolerance, heat intolerance, polydipsia, polyphagia and polyuria.  Genitourinary: Negative for difficulty urinating, dysuria, flank pain, frequency, hematuria, scrotal swelling and testicular pain.  Musculoskeletal: Negative for arthralgias, back pain, gait problem, joint swelling, neck pain and neck stiffness.  Skin: Negative for rash and wound.  Neurological: Negative for dizziness and headaches.  Hematological: Negative for adenopathy. Does not bruise/bleed easily.  Psychiatric/Behavioral: Negative for self-injury and suicidal ideas. The patient is not nervous/anxious.       Objective:    Physical Exam  Constitutional: He is oriented to person, place, and time. He appears well-developed and well-nourished.  Well-nourished well-developed male in no acute distress and who does not appear to be acutely ill, who is wearing mask as per office COVID-19 precautions  Neck: No JVD present. No thyromegaly present.  Cardiovascular: Normal rate and regular rhythm.  Pulmonary/Chest: Effort normal and breath sounds normal.  Abdominal: Soft. There is no abdominal tenderness. There is no rebound and no guarding.  Musculoskeletal:        General: No tenderness or edema. Normal range of motion.     Cervical back: Normal range of motion and neck supple.  Lymphadenopathy:    He has no cervical adenopathy.  Neurological: He is alert and oriented to person, place, and time.  Skin: Skin is warm and dry.  Psychiatric: He has a normal mood and affect. His behavior is normal.  Nursing note and vitals reviewed.   BP 133/90 (BP Location: Right Arm, Patient Position: Sitting, Cuff Size: Normal)   Pulse 87   Temp 98.5 F (36.9 C) (Oral)   Ht 6' (1.829 m)   Wt 139 lb 9.6 oz (63.3 kg)   SpO2 98%   BMI 18.93 kg/m  Wt Readings from Last 3 Encounters:  04/27/19 139 lb 9.6 oz (63.3 kg)  03/31/18 139 lb (63 kg)    02/22/18 135 lb (61.2 kg)    Patient offered but declined HIV screening, tetanus immunization and influenza immunization at today's visit   Lab Results  Component Value Date   TSH 0.986 04/27/2019   Lab Results  Component Value Date   WBC 6.8 04/27/2019   HGB 14.2 04/27/2019   HCT 44.4 04/27/2019   MCV 88 04/27/2019   PLT 206 04/27/2019   Lab Results  Component Value Date   NA 141 02/22/2018   K 4.1 02/22/2018   CO2 22 02/22/2018   GLUCOSE 94 02/22/2018   BUN 18 02/22/2018   CREATININE 0.85 02/22/2018   BILITOT 0.4 02/22/2018   ALKPHOS 77 02/22/2018   AST 19  02/22/2018   ALT 19 02/22/2018   PROT 7.6 02/22/2018   ALBUMIN 4.6 02/22/2018   CALCIUM 9.5 02/22/2018   No results found for: CHOL No results found for: HDL No results found for: LDLCALC No results found for: TRIG No results found for: [facility] Lab Results  Component Value Date   HGBA1C 5.6 04/27/2019      Assessment & Plan:  1. Weight loss, unintentional Patient reports an intentional weight loss.  Will check glucose and hemoglobin A1c to look for possible prediabetes or diabetes as a cause of weight loss along with checking urinalysis to look for ketones or proteinuria, CBC to look for anemia or other blood disorder, T4 and TSH look for thyroid disorder and PSA to look for abnormality requiring further evaluation for prostate cancer.  Patient is encouraged to review his current diet and make sure that his diet is adequate for his nutritional needs.  He is encouraged not to skip meals and to try and eat an actual meal about every 8 hours as well as snack if needed between meals.  You may also use over-the-counter supplements such as Ensure.  He should weigh himself at least once per week and call or return if he is losing more than 2 pounds per week unintentionally.  Patient however reports that he has increased his level of activity, walking which may be contributing to his weight loss. On review of chart, his  weight has actually remained stable. Per GI note, he does not require any additional follow-up of labs indicating past Hepatitis B infection.  - Glucose (CBG) - HgB A1c - Urinalysis Dipstick - CBC - T4 AND TSH - PSA  2. Alopecia Educational handout provided regarding hair loss/alopecia.  Try over-the-counter supplement, biotin, as well as increasing dietary protein.  Will check TSH to look for thyroid abnormality and CBC to look for anemia as these things can contribute to hair loss however alopecia can also occur without unknown cause.  3. Family history of prostate cancer Will check PSA due to patient's age, ethnicity, complaint of weight loss and his family history of prostate cancer.  - PSA  An After Visit Summary was printed and given to the patient.  Follow-up: Return in about 3 months (around 07/26/2019). Monitor weight and return sooner if continued weight loss or any concerns.    Cain Saupe, MD

## 2019-04-28 LAB — CBC
Hematocrit: 44.4 % (ref 37.5–51.0)
Hemoglobin: 14.2 g/dL (ref 13.0–17.7)
MCH: 28.2 pg (ref 26.6–33.0)
MCHC: 32 g/dL (ref 31.5–35.7)
MCV: 88 fL (ref 79–97)
Platelets: 206 x10E3/uL (ref 150–450)
RBC: 5.04 x10E6/uL (ref 4.14–5.80)
RDW: 12.9 % (ref 11.6–15.4)
WBC: 6.8 x10E3/uL (ref 3.4–10.8)

## 2019-04-28 LAB — PSA: Prostate Specific Ag, Serum: 0.9 ng/mL (ref 0.0–4.0)

## 2019-04-28 LAB — T4 AND TSH
T4, Total: 7.5 ug/dL (ref 4.5–12.0)
TSH: 0.986 u[IU]/mL (ref 0.450–4.500)

## 2019-05-03 ENCOUNTER — Telehealth: Payer: Self-pay | Admitting: Family Medicine

## 2019-05-03 NOTE — Telephone Encounter (Signed)
LMOM

## 2019-05-03 NOTE — Telephone Encounter (Signed)
Patient aunt called requesting lab results. Please f/u

## 2019-05-04 ENCOUNTER — Encounter: Payer: Self-pay | Admitting: *Deleted

## 2019-05-21 ENCOUNTER — Encounter: Payer: Self-pay | Admitting: Family Medicine

## 2019-06-28 ENCOUNTER — Ambulatory Visit: Payer: Self-pay | Attending: Internal Medicine

## 2019-07-29 ENCOUNTER — Other Ambulatory Visit: Payer: Self-pay

## 2019-07-29 ENCOUNTER — Ambulatory Visit: Payer: Self-pay | Attending: Internal Medicine

## 2019-07-29 DIAGNOSIS — Z23 Encounter for immunization: Secondary | ICD-10-CM

## 2019-07-29 NOTE — Progress Notes (Signed)
   Covid-19 Vaccination Clinic  Name:  Brent [name]    MRN: [id number] DOB: November 24, 1975  07/29/2019  [name] was observed post Covid-19 immunization for 15 minutes without incident. He was provided with Vaccine Information Sheet and instruction to access the V-Safe system.   [name] was instructed to call 911 with any severe reactions post vaccine: Marland Kitchen Difficulty breathing  . Swelling of face and throat  . A fast heartbeat  . A bad rash all over body  . Dizziness and weakness   Immunizations Administered    Name Date Dose VIS Date Route   Pfizer COVID-19 Vaccine 07/29/2019 10:37 AM 0.3 mL 04/28/2019 Intramuscular   Manufacturer: [employer], [employer]   Lot: [id number]   NDC: [address]-0

## 2019-08-23 ENCOUNTER — Ambulatory Visit: Payer: Self-pay | Attending: Internal Medicine

## 2019-08-23 DIAGNOSIS — Z23 Encounter for immunization: Secondary | ICD-10-CM

## 2019-08-23 NOTE — Progress Notes (Signed)
   Covid-19 Vaccination Clinic  Name:  CURTEZ BRALLIER    MRN: [id number] DOB: [birth year]  08/23/2019  Mr. Harbeson was observed post Covid-19 immunization for 15 minutes without incident. He was provided with Vaccine Information Sheet and instruction to access the V-Safe system.   Mr. Enns was instructed to call 911 with any severe reactions post vaccine: Marland Kitchen Difficulty breathing  . Swelling of face and throat  . A fast heartbeat  . A bad rash all over body  . Dizziness and weakness   Immunizations Administered    Name Date Dose VIS Date Route   Pfizer COVID-19 Vaccine 08/23/2019  9:41 AM 0.3 mL 04/28/2019 Intramuscular   Manufacturer: [employer], [employer]   Lot: [id number]   NDC: [address]-5

## 2019-09-03 NOTE — Progress Notes (Signed)
Patient ID: Brent [name], male    DOB: [birth year]  MRN: [id number]  CC: Hearing problem   Subjective: Brent [name] is a 44 y.o. male with history of weight loss who presents for hearing problem.  1. HEARING PROBLEM: Patient presents today with his aunt, Ms. [name], who reports that she feels that her nephew may have a hearing problem. Reports the patient has been living with her for 8 years but since 4 months ago when she speaks to the patient he will not respond to her. She reports that she will repeat herself multiple times and he will not respond so she feels as if his hearing has been affected in some way. She also reports that the patient works at [employer] as a Nature conservation officer. States the patient's manager told her that the patient does not respond appropriately at work as well and will go without doing some of his required tasks. Aunt reports patient has problem with lying and that sometimes he does not go to work when he says he is and will lie about his whereabouts at any given time.   When patient asked why he does not respond to his aunt he states "I don't know" When patient asked why he does not respond to his manager he states "I do not want to be bothered." Patient states "Sometimes I'm forgetful." Patient reports that he is bored since the pandemic began and wants to go out and have fun. Patient says he likes to go to the mall and [employer] for fun. Aunt reports that she explained to the patient that because of the pandemic he is not able to go out and have fun as he once did.   Aunt reports that patient drinks alcohol when he isn't supposed to. When Aunt asked why patient is not supposed to drink alcohol she states "Because he is living in my house and I do not allow that." Patient then turns to Aunt and says "Well you do it." of which Aunt denies that she does. Aunt reports patient stole a flask of liquor from her and a bottle of wine. When patient asked why he did that he states "I  just thought it was something to drink." "I didn't know what it was." Aunt reports this is not true as he has done this on multiple occasions stealing alcohol from not only her but from family members as well. Reports he has stolen beer, Rum from [place], and mixed drinks. Aunt reports sometimes when patient drinks alcohol he has angry outbursts and uses profanity but has not been extremely violent.  Patient denies feelings of depression and anxiety, thoughts of suicide ideation, and self-harm.  Patient denies ear pain, discharge, trauma, sensation of fullness or foreign body in the ear, upper respiratory symptoms, toothache, fever, tinnitus, dizziness, headache, hearing loss, popping and clicking sounds, loss of balance, and vision change. Patient reports that he does not have a hearing problem and can hear well.   Patient Active Problem List   Diagnosis Date Noted  . WEIGHT LOSS 02/18/2009     Current Outpatient Medications on File Prior to Visit  Medication Sig Dispense Refill  . pantoprazole (PROTONIX) 40 MG tablet Take 1 tablet (40 mg total) by mouth 2 (two) times daily. (Patient not taking: Reported on 04/27/2019) 60 tablet 1   No current facility-administered medications on file prior to visit.    No Known Allergies  Social History   Socioeconomic History  . Marital status: Single  Spouse name: Not on file  . Number of children: 0  . Years of education: Not on file  . Highest education level: Not on file  Occupational History  . Occupation: unemployed  Tobacco Use  . Smoking status: Never Smoker  . Smokeless tobacco: Never Used  Substance and Sexual Activity  . Alcohol use: Yes  . Drug use: No  . Sexual activity: Never  Other Topics Concern  . Not on file  Social History Narrative  . Not on file   Social Determinants of Health   Financial Resource Strain:   . Difficulty of Paying Living Expenses:   Food Insecurity:   . Worried About Programme researcher, broadcasting/film/video in the  Last Year:   . Barista in the Last Year:   Transportation Needs:   . Freight forwarder (Medical):   Marland Kitchen Lack of Transportation (Non-Medical):   Physical Activity:   . Days of Exercise per Week:   . Minutes of Exercise per Session:   Stress:   . Feeling of Stress :   Social Connections:   . Frequency of Communication with Friends and Family:   . Frequency of Social Gatherings with Friends and Family:   . Attends Religious Services:   . Active Member of Clubs or Organizations:   . Attends Banker Meetings:   Marland Kitchen Marital Status:   Intimate Partner Violence:   . Fear of Current or Ex-Partner:   . Emotionally Abused:   Marland Kitchen Physically Abused:   . Sexually Abused:     Family History  Problem Relation Age of Onset  . Ovarian cancer Paternal Grandmother   . Prostate cancer Paternal Grandfather     Past Surgical History:  Procedure Laterality Date  . NO PAST SURGERIES      ROS: Review of Systems Negative except as stated above  PHYSICAL EXAM: Vitals with BMI 09/05/2019 04/27/2019 03/31/2018  Height 6\' 6"  6\' 0"  6\' 0"   Weight 143 lbs 8 oz 139 lbs 10 oz 139 lbs  BMI 16.59 18.93 18.85  Systolic 136 133  Diastolic 84 90 80  Pulse 79 87 74  SpO2- 99%, room air Temperature- 97.5 F, oral  Physical Exam  General appearance - alert, well appearing, and in no distress and oriented to person, place, and time Mental status - alert, oriented to person, place, and time, normal mood, behavior, speech, dress, motor activity, and thought processes Eyes - pupils equal and reactive, extraocular eye movements intact Ears - bilateral TM's and external ear canals normal Nose - normal and patent, no erythema, discharge or polyps and normal nontender sinuses Mouth - mucous membranes moist, pharynx normal without lesions Neck - supple, no significant adenopathy Lymphatics - no palpable lymphadenopathy, no hepatosplenomegaly Chest - clear to auscultation, no wheezes,  rales or rhonchi, symmetric air entry, no tachypnea, retractions or cyanosis Heart - normal rate, regular rhythm, normal S1, S2, no murmurs, rubs, clicks or gallops  ASSESSMENT AND PLAN: 1. Hearing within normal limits in both ears: -Patient history and physical examination supports patient has bilateral normal hearing. Patient denies all signs and symptoms of hearing loss and ear infection. Bilateral ear exam unremarkable.  -Patient reporting that he can hear well. Patient expresses that he does not respond to family and his coworkers when they speak to him because he does not want to be bothered.  -During history taking patient conversing with his Aunt and I without need need for repeating any statements or questions. Patient  is alert and oriented to person, place, time, and situation.  -It seems patient may have some concern about not being able to interact in society as he normally would because of pandemic restrictions over the course of the last year. Patient may benefit from a social work consultation. -Follow-up with primary physician as needed. - Ambulatory referral to Social Work   Patient was given the opportunity to ask questions.  Patient verbalized understanding of the plan and was able to repeat key elements of the plan. Patient was given clear instructions to go to Emergency Department or return to medical center if symptoms don't improve, worsen, or new problems develop.The patient verbalized understanding.   Requested Prescriptions    No prescriptions requested or ordered in this encounter    Brent Swanner Zachery Dauer, NP

## 2019-09-05 ENCOUNTER — Other Ambulatory Visit: Payer: Self-pay

## 2019-09-05 ENCOUNTER — Ambulatory Visit: Payer: Self-pay | Attending: Family | Admitting: Family

## 2019-09-05 ENCOUNTER — Encounter: Payer: Self-pay | Admitting: Family

## 2019-09-05 VITALS — BP 136/84 | HR 79 | Temp 97.5°F | Resp 16 | Ht 78.0 in | Wt 143.5 lb

## 2019-09-05 DIAGNOSIS — Z0110 Encounter for examination of ears and hearing without abnormal findings: Secondary | ICD-10-CM

## 2019-09-05 NOTE — Progress Notes (Signed)
Male acquaintance, patient's aunt states he is deep in thought, focus seems off, not responding to anything w/i the last 4 mos.   He is told to do something but it does not register.   Patient alert and oriented name, DOB, able to report where he works and his position he holds at his job. Most questions he answers appropriately other than why he came in to be seen today. He referenced to his aunt.

## 2019-09-05 NOTE — Patient Instructions (Signed)
Follow-up with primary physician as needed. Earache, Adult An earache, or ear pain, can be caused by many things, including:  An infection.  Ear wax buildup.  Ear pressure.  Something in the ear that should not be there (foreign body).  A sore throat.  Tooth problems.  Jaw problems. Treatment of the earache will depend on the cause. If the cause is not clear or cannot be determined, you may need to watch your symptoms until your earache goes away or until a cause is found. Follow these instructions at home: Medicines  Take or apply over-the-counter and prescription medicines only as told by your health care provider.  If you were prescribed an antibiotic medicine, use it as told by your health care provider. Do not stop using the antibiotic even if you start to feel better.  Do not put anything in your ear other than medicine that is prescribed by your health care provider. Managing pain If directed, apply heat to the affected area as often as told by your health care provider. Use the heat source that your health care provider recommends, such as a moist heat pack or a heating pad.  Place a towel between your skin and the heat source.  Leave the heat on for 20-30 minutes.  Remove the heat if your skin turns bright red. This is especially important if you are unable to feel pain, heat, or cold. You may have a greater risk of getting burned. If directed, put ice on the affected area as often as told by your health care provider. To do this:      Put ice in a plastic bag.  Place a towel between your skin and the bag.  Leave the ice on for 20 minutes, 2-3 times a day. General instructions  Pay attention to any changes in your symptoms.  Try resting in an upright position instead of lying down. This may help to reduce pressure in your ear and relieve pain.  Chew gum if it helps to relieve your ear pain.  Treat any allergies as told by your health care  provider.  Drink enough fluid to keep your urine pale yellow.  It is up to you to get the results of any tests that were done. Ask your health care provider, or the department that is doing the tests, when your results will be ready.  Keep all follow-up visits as told by your health care provider. This is important. Contact a health care provider if:  Your pain does not improve within 2 days.  Your earache gets worse.  You have new symptoms.  You have a fever. Get help right away if you:  Have a severe headache.  Have a stiff neck.  Have trouble swallowing.  Have redness or swelling behind your ear.  Have fluid or blood coming from your ear.  Have hearing loss.  Feel dizzy. Summary  An earache, or ear pain, can be caused by many things.  Treatment of the earache will depend on the cause. Follow recommendations from your health care provider to treat your ear pain.  If the cause is not clear or cannot be determined, you may need to watch your symptoms until your earache goes away or until a cause is found.  Keep all follow-up visits as told by your health care provider. This is important. This information is not intended to replace advice given to you by your health care provider. Make sure you discuss any questions you have with your  health care provider. Document Revised: 12/10/2018 Document Reviewed: 12/10/2018 Elsevier Patient Education  2020 [employer].

## 2019-11-16 ENCOUNTER — Telehealth: Payer: Self-pay | Admitting: Licensed Clinical Social Worker

## 2019-11-16 NOTE — Telephone Encounter (Signed)
Call placed to patient regarding IBH referral. LCSW left message requesting a return call.  

## 2019-12-01 ENCOUNTER — Telehealth: Payer: Self-pay | Admitting: Licensed Clinical Social Worker

## 2019-12-01 NOTE — Telephone Encounter (Signed)
Call placed regarding IBH referral. LCSW unable to leave message due to voicemail not being set up.

## 2019-12-04 ENCOUNTER — Institutional Professional Consult (permissible substitution): Payer: Self-pay | Admitting: Licensed Clinical Social Worker

## 2019-12-07 ENCOUNTER — Ambulatory Visit: Payer: Self-pay | Attending: Family Medicine | Admitting: Licensed Clinical Social Worker

## 2019-12-07 ENCOUNTER — Other Ambulatory Visit: Payer: Self-pay

## 2019-12-07 DIAGNOSIS — F4323 Adjustment disorder with mixed anxiety and depressed mood: Secondary | ICD-10-CM

## 2019-12-14 NOTE — BH Specialist Note (Signed)
Integrated Behavioral Health Initial Visit  MRN: [id number] Name: Brent [name]  Number of Integrated Behavioral Health Clinician visits:: 1/6 Session Start time: 8:40 AM  Session End time: 9:10 AM Total time: 30  Type of Service: Integrated Behavioral Health- Individual Interpretor:No. Interpretor Name and Language: NA   SUBJECTIVE: Brent [name] is a 44 y.o. male accompanied by Paternal Aunt Patient was referred by NP Zonia Kief for stress. Patient reports the following symptoms/concerns: Pt's aunt is concerned about pt's behavior and recent increase in irritability Duration of problem: Ongoing; Severity of problem: na  OBJECTIVE: Mood: Anxious and Affect: Appropriate Risk of harm to self or others: No plan to harm self or others  LIFE CONTEXT: Family and Social: Pt resides with aunt. Has additional family that resides locally School/Work: Pt is employed part time at [employer] Self-Care: Pt likes to spend time alone Life Changes: Pt and aunt appear to have communication challenges. Pt may be having difficulty managing stress  GOALS ADDRESSED: Patient will: 1. Reduce symptoms of: stress 2. Increase knowledge and/or ability of: coping skills and healthy habits  3. Demonstrate ability to: Increase healthy adjustment to current life circumstances and Increase adequate support systems for patient/family  INTERVENTIONS: Interventions utilized: Solution-Focused Strategies, Supportive Counseling and Psychoeducation and/or Health Education  Standardized Assessments completed: Not Needed  ASSESSMENT: Patient currently experiencing stress triggered by family. Pt's aunt is concerned about increase in irritability and low motivation.   Patient may benefit from supportive resources. LCSW discussed correlation between one's physical and mental health, in addition, to how stress can negatively impact both. Family provided consent for LCSW to complete referral to [facility] for a case  manager to provide additional support for patient.   PLAN: 1. Follow up with behavioral health clinician on : Contact LCSW with additional behavioral health and/or resource needs 2. Behavioral recommendations: Utilize healthy coping skills discussed 3. Referral(s): Integrated Behavioral Health Services (In Clinic) 4. "From scale of 1-10, how likely are you to follow plan?":   Bridgett Larsson, LCSW 12/14/2019 11:43 AM

## 2020-01-24 ENCOUNTER — Ambulatory Visit: Payer: Self-pay | Admitting: Physician Assistant

## 2020-02-09 ENCOUNTER — Ambulatory Visit: Payer: Self-pay | Attending: Physician Assistant | Admitting: Family Medicine

## 2020-02-09 ENCOUNTER — Other Ambulatory Visit: Payer: Self-pay

## 2020-02-09 ENCOUNTER — Encounter: Payer: Self-pay | Admitting: Family Medicine

## 2020-02-09 ENCOUNTER — Ambulatory Visit: Payer: Self-pay | Admitting: *Deleted

## 2020-02-09 VITALS — BP 133/79 | HR 79 | Resp 16 | Wt 141.6 lb

## 2020-02-09 DIAGNOSIS — R350 Frequency of micturition: Secondary | ICD-10-CM

## 2020-02-09 DIAGNOSIS — R3915 Urgency of urination: Secondary | ICD-10-CM

## 2020-02-09 DIAGNOSIS — Z23 Encounter for immunization: Secondary | ICD-10-CM

## 2020-02-09 LAB — POCT URINALYSIS DIP (CLINITEK)
Bilirubin, UA: NEGATIVE
Blood, UA: NEGATIVE
Glucose, UA: NEGATIVE mg/dL
Ketones, POC UA: NEGATIVE mg/dL
Leukocytes, UA: NEGATIVE
Nitrite, UA: NEGATIVE
POC PROTEIN,UA: NEGATIVE
Spec Grav, UA: 1.025
Urobilinogen, UA: 0.2 U/dL
pH, UA: 7

## 2020-02-09 NOTE — Progress Notes (Signed)
Established Patient Office Visit  Subjective:  Patient ID: Brent [name], male    DOB: 01-30-1976  Age: 44 y.o. MRN: [id number]  CC:  Chief Complaint  Patient presents with  . Anxiety   Patient denies current anxiety but reports issues with urinary urgency- C. Ragena Fiola, MD  HPI [name], 44 yo male who is seen secondary to the complaint of ongoing issues with urinary urgency and at times frequency of urination. He denies any dysuria- no pain or burning with urination. He denies any issues or concerns regarding sexually transmitted infections. He denies any increased thirst or blurred vision. He gets the sudden onset of the need to urinate. He denies any incontinence related to urgency. He denies any back pain, no abdominal or pelvic pain. He does not have issues with getting up at night to urinate and no weakening of the urinary stream. He does not have to strain to urinate. He wonders if his symptoms are related to his water intake.   Past Medical History:  Diagnosis Date  . Anxiety   . Hepatitis B     Past Surgical History:  Procedure Laterality Date  . NO PAST SURGERIES      Family History  Problem Relation Age of Onset  . Ovarian cancer Paternal Grandmother   . Prostate cancer Paternal Grandfather     Social History   Socioeconomic History  . Marital status: Single    Spouse name: Not on file  . Number of children: 0  . Years of education: Not on file  . Highest education level: Not on file  Occupational History  . Occupation: unemployed  Tobacco Use  . Smoking status: Never Smoker  . Smokeless tobacco: Never Used  Vaping Use  . Vaping Use: Never used  Substance and Sexual Activity  . Alcohol use: Yes  . Drug use: No  . Sexual activity: Never  Other Topics Concern  . Not on file  Social History Narrative  . Not on file   Social Determinants of Health   Financial Resource Strain:   . Difficulty of Paying Living Expenses: Not on file  Food  Insecurity:   . Worried About Programme researcher, broadcasting/film/video in the Last Year: Not on file  . Ran Out of Food in the Last Year: Not on file  Transportation Needs:   . Lack of Transportation (Medical): Not on file  . Lack of Transportation (Non-Medical): Not on file  Physical Activity:   . Days of Exercise per Week: Not on file  . Minutes of Exercise per Session: Not on file  Stress:   . Feeling of Stress : Not on file  Social Connections:   . Frequency of Communication with Friends and Family: Not on file  . Frequency of Social Gatherings with Friends and Family: Not on file  . Attends Religious Services: Not on file  . Active Member of Clubs or Organizations: Not on file  . Attends Banker Meetings: Not on file  . Marital Status: Not on file  Intimate Partner Violence:   . Fear of Current or Ex-Partner: Not on file  . Emotionally Abused: Not on file  . Physically Abused: Not on file  . Sexually Abused: Not on file    No outpatient medications prior to visit.   No facility-administered medications prior to visit.    No Known Allergies  ROS Review of Systems  Constitutional: Negative for chills, fatigue and fever.  HENT: Negative for sore throat  and trouble swallowing.   Respiratory: Negative for cough and shortness of breath.   Cardiovascular: Negative for chest pain, palpitations and leg swelling.  Gastrointestinal: Negative for abdominal pain, constipation, diarrhea and nausea.  Endocrine: Positive for polyuria. Negative for polydipsia and polyphagia.  Genitourinary: Positive for frequency. Negative for discharge, dysuria, flank pain and hematuria.  Musculoskeletal: Negative for arthralgias and back pain.  Skin: Negative for rash and wound.  Neurological: Negative for dizziness and headaches.  Hematological: Negative for adenopathy. Does not bruise/bleed easily.  Psychiatric/Behavioral: Negative for suicidal ideas. The patient is not nervous/anxious.         Objective:    Physical Exam Constitutional:      Appearance: Normal appearance.  Cardiovascular:     Rate and Rhythm: Normal rate and regular rhythm.  Pulmonary:     Effort: Pulmonary effort is normal.     Breath sounds: Normal breath sounds.  Abdominal:     Palpations: Abdomen is soft.     Tenderness: There is no abdominal tenderness. There is no right CVA tenderness, left CVA tenderness, guarding or rebound.  Musculoskeletal:        General: No tenderness.     Cervical back: Normal range of motion and neck supple.     Right lower leg: No edema.     Left lower leg: No edema.  Skin:    General: Skin is warm and dry.  Neurological:     Mental Status: He is alert and oriented to person, place, and time. Mental status is at baseline.  Psychiatric:        Mood and Affect: Mood normal.        Behavior: Behavior normal.     BP 133/79   Pulse 79   Resp 16   Wt 141 lb 9.6 oz (64.2 kg)   SpO2 100%   BMI 16.36 kg/m  Wt Readings from Last 3 Encounters:  02/09/20 141 lb 9.6 oz (64.2 kg)  09/05/19 143 lb 8 oz (65.1 kg)  04/27/19 139 lb 9.6 oz (63.3 kg)     Health Maintenance Due  Topic Date Due  . HIV Screening  Never done  . INFLUENZA VACCINE  Never done    He agrees to have influenza immunization  Lab Results  Component Value Date   TSH 0.986 04/27/2019   Lab Results  Component Value Date   WBC 6.8 04/27/2019   HGB 14.2 04/27/2019   HCT 44.4 04/27/2019   MCV 88 04/27/2019   PLT 206 04/27/2019   Lab Results  Component Value Date   NA 141 02/22/2018   K 4.1 02/22/2018   CO2 22 02/22/2018   GLUCOSE 94 02/22/2018   BUN 18 02/22/2018   CREATININE 0.85 02/22/2018   BILITOT 0.4 02/22/2018   ALKPHOS 77 02/22/2018   AST 19 02/22/2018   ALT 19 02/22/2018   PROT 7.6 02/22/2018   ALBUMIN 4.6 02/22/2018   CALCIUM 9.5 02/22/2018   No results found for: CHOL No results found for: HDL No results found for: LDLCALC No results found for: TRIG No results found  for: [facility] Lab Results  Component Value Date   HGBA1C 5.6 04/27/2019      Assessment & Plan:  1. Urinary urgency 2. Urinary frequency Will check UA and culture to look for any signs of a urinary tract infection. Will also check electrolytes and renal function with a BMP as well as checking Hgb A1c for diabetes or prediabetes which could cause his symptoms. He will  also be referred to Urology for further evaluation and treatment.  - POCT URINALYSIS DIP (CLINITEK) - Urine Culture - Basic Metabolic Panel - Hemoglobin A1c - Ambulatory referral to  Urology  3. Need for immunization against influenza Patient was offered and agreed to have influenza immunization at today's visit. He was also provided with educational material regarding the immunization.    Follow-up: Return in about 4 weeks (around 03/08/2020) for follow-up of current issues.    Cain Saupe, MD

## 2020-02-09 NOTE — Telephone Encounter (Signed)
Per initial encounter "Pt calling in and is requesting to have his lab results. Please advise. " , spoke with pt and his aunt; they were informed his results from labs drawn 02/09/20 are not ready, and once the MD reviews the results they will be contacted; the both verbalized understanding.  Reason for Disposition  [1] Other NON-URGENT information for PCP AND [2] does not require PCP response  Answer Assessment - Initial Assessment Questions 1. REASON FOR CALL or QUESTION: "What is your reason for calling today?" or "How can I best help you?" or "What question do you have that I can help answer?"     Lab results from 02/09/20  Answer Assessment - Initial Assessment Questions 1. REASON FOR CALL or QUESTION: "What is your reason for calling today?" or "How can I best help you?" or "What question do you have that I can help answer?"     Lab results from 02/09/20 2. CALLER: Document the source of call. (e.g., laboratory, patient).   patient  Protocols used: PCP CALL - NO TRIAGE-A-AH, INFORMATION ONLY CALL - NO TRIAGE-A-AH

## 2020-02-10 LAB — HEMOGLOBIN A1C
Est. average glucose Bld gHb Est-mCnc: 120 mg/dL
Hgb A1c MFr Bld: 5.8 % — ABNORMAL HIGH (ref 4.8–5.6)

## 2020-02-10 LAB — BASIC METABOLIC PANEL WITH GFR
BUN/Creatinine Ratio: 13 (ref 9–20)
BUN: 12 mg/dL (ref 6–24)
CO2: 27 mmol/L (ref 20–29)
Calcium: 9.5 mg/dL (ref 8.7–10.2)
Chloride: 103 mmol/L (ref 96–106)
Creatinine, Ser: 0.92 mg/dL (ref 0.76–1.27)
GFR calc Af Amer: 118 mL/min/1.73
GFR calc non Af Amer: 102 mL/min/1.73
Glucose: 71 mg/dL (ref 65–99)
Potassium: 4.5 mmol/L (ref 3.5–5.2)
Sodium: 142 mmol/L (ref 134–144)

## 2020-02-11 LAB — URINE CULTURE

## 2020-02-12 NOTE — Telephone Encounter (Signed)
Labs discussed w/ pt and aunt this morning via phone call

## 2020-03-08 ENCOUNTER — Ambulatory Visit: Payer: Self-pay | Attending: Family | Admitting: Family

## 2020-03-08 ENCOUNTER — Other Ambulatory Visit: Payer: Self-pay

## 2020-03-08 NOTE — Progress Notes (Signed)
Patient did not show for appointment. Called patient at 38 and again at 1112, leaving a voicemail with each call without response.

## 2020-03-08 NOTE — Progress Notes (Addendum)
Attempted to call patient again at 1121 am without response.

## 2020-04-04 ENCOUNTER — Ambulatory Visit: Payer: Self-pay

## 2020-04-04 ENCOUNTER — Ambulatory Visit: Payer: Self-pay | Attending: Internal Medicine

## 2020-04-04 DIAGNOSIS — Z23 Encounter for immunization: Secondary | ICD-10-CM

## 2020-04-04 NOTE — Progress Notes (Signed)
   Covid-19 Vaccination Clinic  Name:  Brent [name]    MRN: [id number] DOB: 1976/02/10  04/04/2020  Mr. Brent [name] was observed post Covid-19 immunization for 15 minutes without incident. He was provided with Vaccine Information Sheet and instruction to access the V-Safe system.   Mr. Brent [name] was instructed to call 911 with any severe reactions post vaccine: Marland Kitchen Difficulty breathing  . Swelling of face and throat  . A fast heartbeat  . A bad rash all over body  . Dizziness and weakness   Immunizations Administered    Name Date Dose VIS Date Route   Pfizer COVID-19 Vaccine 04/04/2020  2:18 PM 0.3 mL 03/06/2020 Intramuscular   Manufacturer: [employer], [employer]   Lot: [id number]   NDC: [address]-4

## 2020-10-08 IMAGING — CT CT ABD-PELV W/ CM
2 of 5 series · 12 of 36 positions shown, 15 images · IV contrast (APPLIED)
Comparison: None

CLINICAL DATA: Weight loss, hepatitis-B, halitosis

EXAM:
CT CHEST, ABDOMEN, AND PELVIS WITH CONTRAST
TECHNIQUE: Multidetector CT imaging of the chest, abdomen and pelvis was
performed following the standard protocol during bolus
administration of intravenous contrast. Sagittal and coronal MPR
images reconstructed from axial data set.
CONTRAST:  100mL OMNIPAQUE IOHEXOL 300 MG/ML SOLN IV. Dilute oral
contrast.

[Series 2: cap with · axial · 0.76mm/px · z∈[-719,-134]mm · 9 of 143 slices shown, 12 images]
[im 13/143  mediastinal]
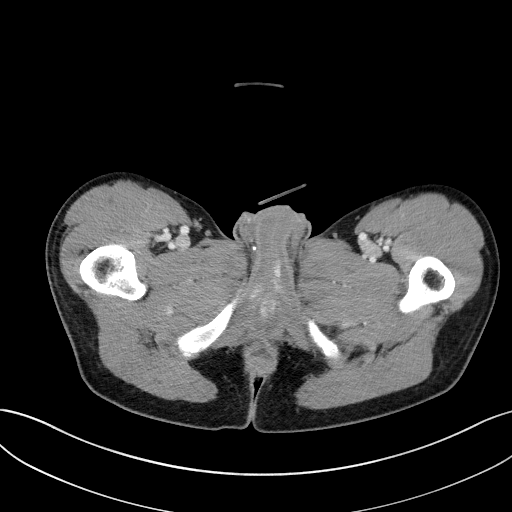
[im 13/143  lung]
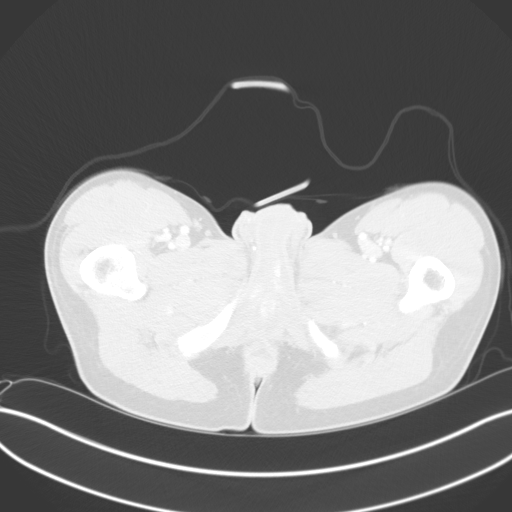
[im 26/143  lung]
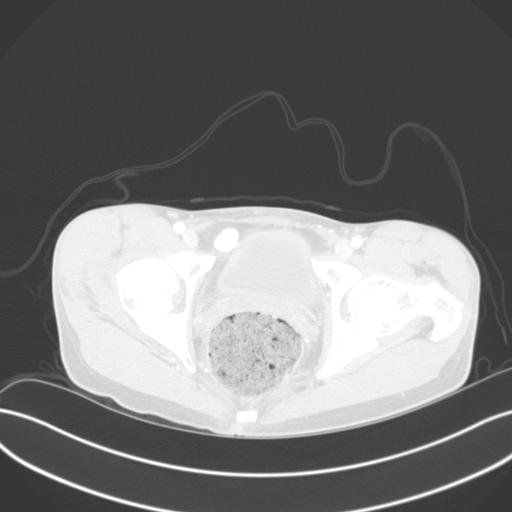
[im 39/143  lung]
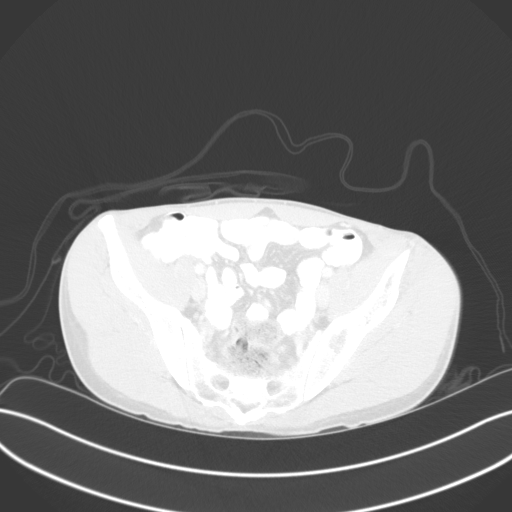
[im 52/143  lung]
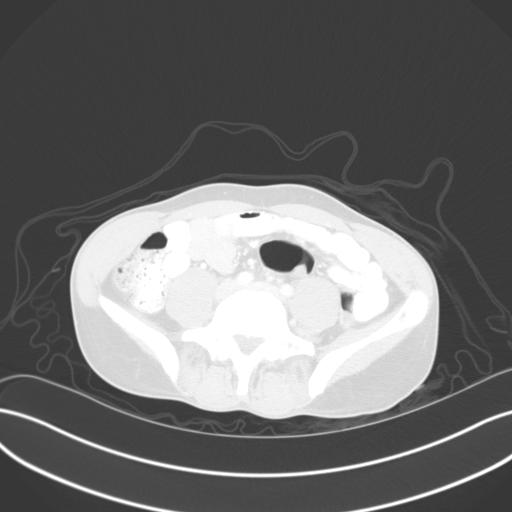
[im 78/143  mediastinal]
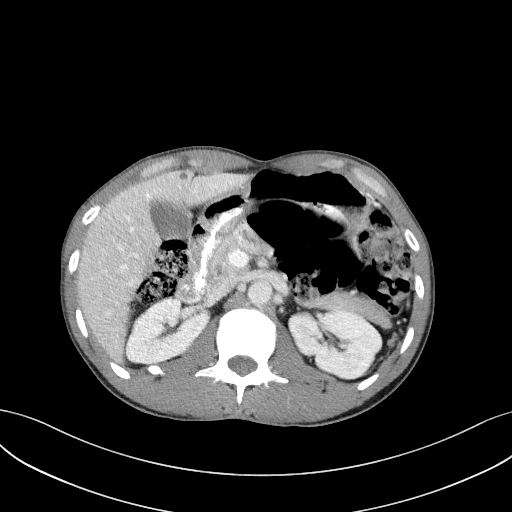
[im 78/143  lung]
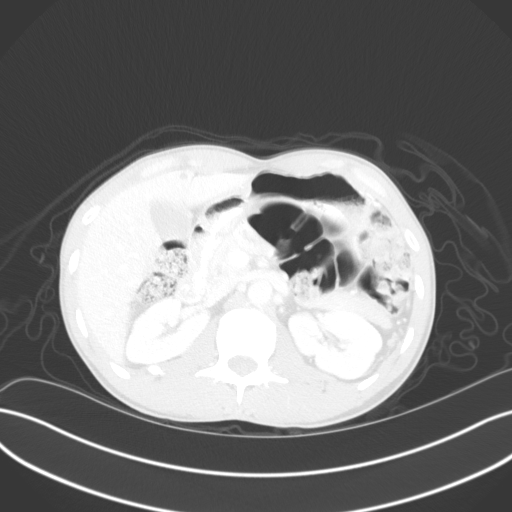
[im 91/143  lung]
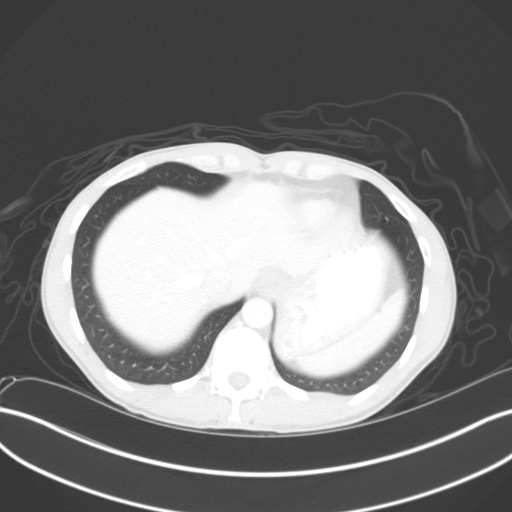
[im 104/143  lung]
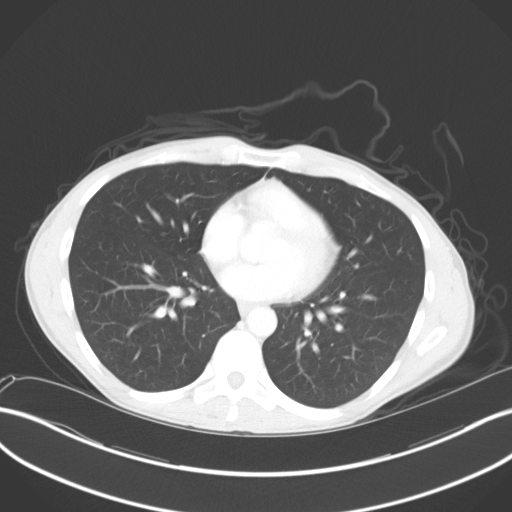
[im 117/143  lung]
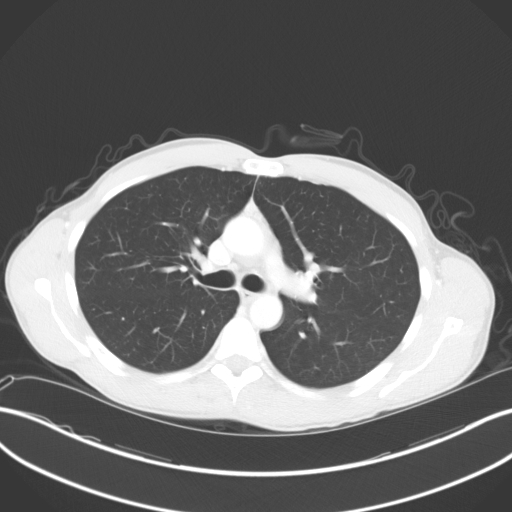
[im 130/143  mediastinal]
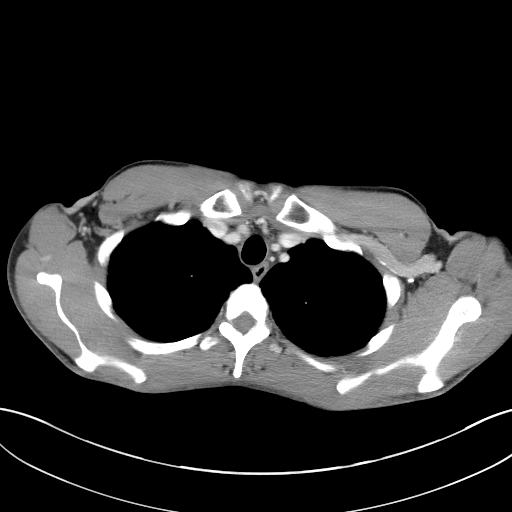
[im 130/143  lung]
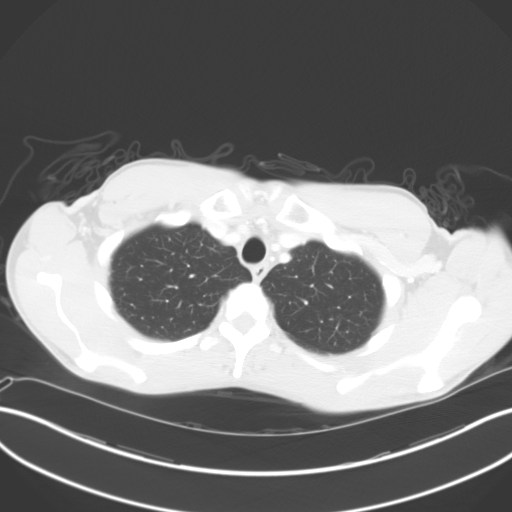

[Series 4: coronals · coronal · 0.68mm/px · 3 of 118 slices shown]
[im 24/118  lung]
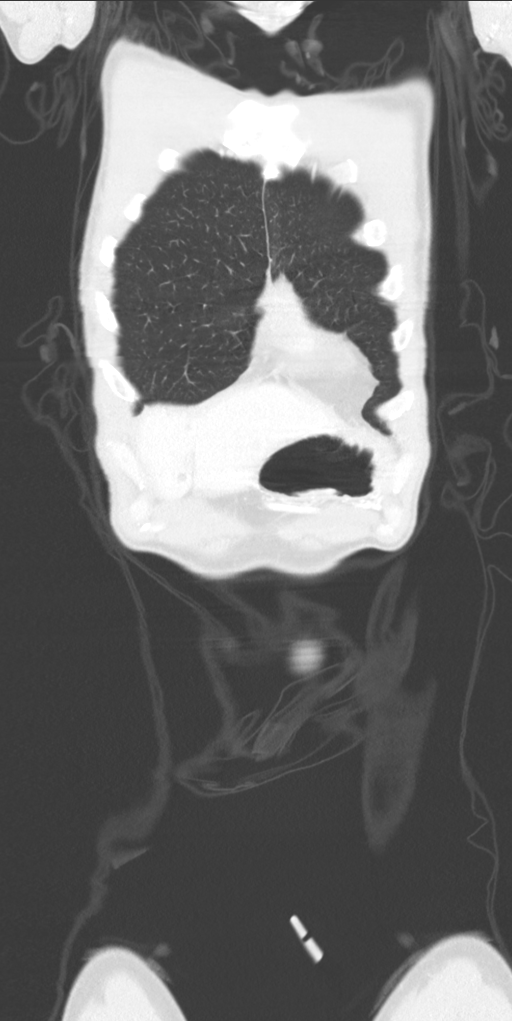
[im 47/118  lung]
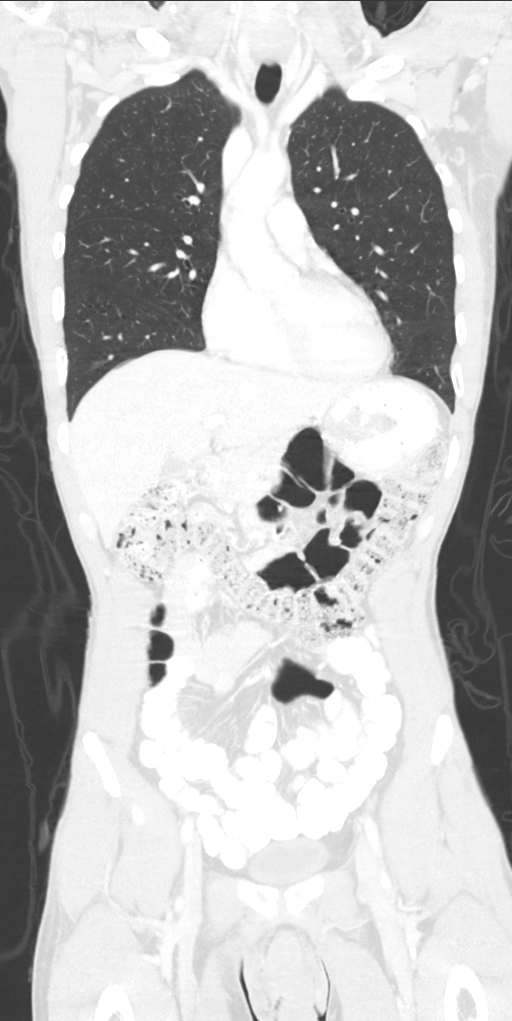
[im 71/118  lung]
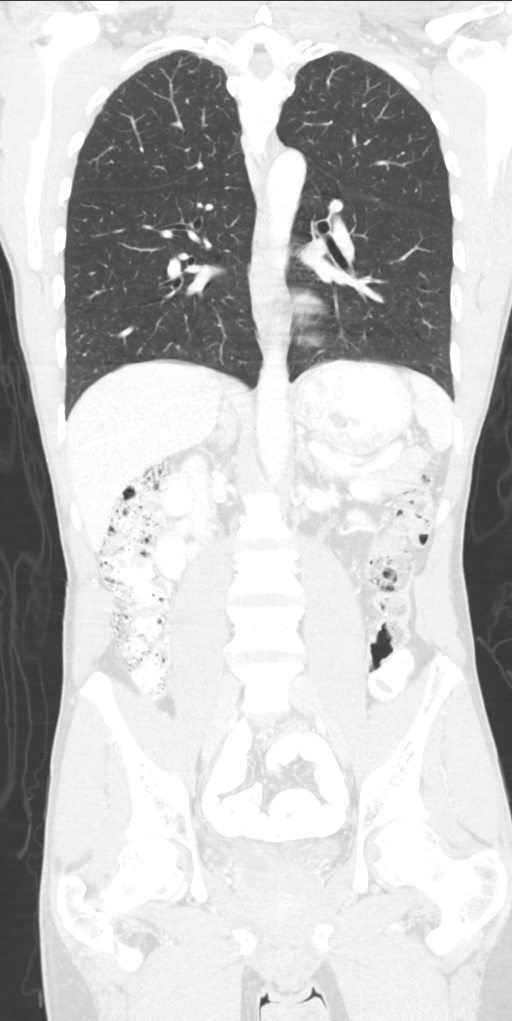

[12 of 36 positions shown; findings below may reference images not displayed]

FINDINGS: CT CHEST FINDINGS

Cardiovascular: Thoracic vascular structures patent on nondedicated
exam. Aorta normal caliber. Minimal atherosclerotic calcification
aortic arch. Minimal fluid at superior pericardial recess.

Mediastinum/Nodes: Question small collapsed hiatal hernia versus
thickening of distal esophagus. Remainder of esophagus unremarkable.
Base of cervical region normal appearance. Minimal residual thymic
tissue in anterior mediastinum. No thoracic adenopathy..

Lungs/Pleura: Lungs clear. No infiltrate, pleural effusion,
pneumothorax or mass.

Musculoskeletal: Unremarkable

CT ABDOMEN PELVIS FINDINGS

Hepatobiliary: LEFT lobe hepatic cyst 6 mm diameter image 66.
Minimal focal fatty infiltration of liver adjacent to falciform
fissure. Gallbladder and remainder of liver unremarkable.

Pancreas: Normal appearance

Spleen: Normal appearance

Adrenals/Urinary Tract: Adrenal glands, kidneys, and ureters normal
appearance. Bladder wall appears minimally thickened but bladder is
underdistended, suspect artifact.

Stomach/Bowel: Stool throughout colon with increased stool in
rectum. Nonvisualization of appendix, no pericecal inflammatory
process seen. Significant food debris in stomach. Bowel loops
otherwise unremarkable.

Vascular/Lymphatic: Vascular structures grossly patent on non
targeted exam. Aorta normal caliber. No adenopathy.

Reproductive: Minimal prostatic enlargement

Other: No free air or free fluid. No hernia or acute inflammatory
process.

Musculoskeletal: Unremarkable
IMPRESSION: Suspicion of wall thickening of the distal esophagus extending to
the gastroesophageal junction, less likely representing a collapsed
hiatal hernia; recommend upper endoscopy or esophagram to assess.

Mildly increased stool in colon and rectum.

No additional significant intrathoracic, intra-abdominal or
intrapelvic abnormalities.

## 2020-11-11 ENCOUNTER — Ambulatory Visit: Payer: Self-pay | Attending: Internal Medicine | Admitting: Nurse Practitioner

## 2020-11-11 ENCOUNTER — Other Ambulatory Visit: Payer: Self-pay

## 2020-11-11 ENCOUNTER — Telehealth: Payer: Self-pay | Admitting: Nurse Practitioner

## 2020-11-11 NOTE — Telephone Encounter (Signed)
Patient's Aunt states there was a dental emergency and she will need to reschedule Mr. Bouwens to establish care with a provider

## 2020-11-21 ENCOUNTER — Ambulatory Visit: Payer: Self-pay | Admitting: Internal Medicine

## 2020-12-06 ENCOUNTER — Ambulatory Visit: Payer: Self-pay

## 2020-12-11 ENCOUNTER — Ambulatory Visit: Payer: Self-pay | Admitting: Critical Care Medicine

## 2020-12-24 ENCOUNTER — Ambulatory Visit: Payer: Self-pay | Admitting: Critical Care Medicine

## 2020-12-26 ENCOUNTER — Ambulatory Visit: Payer: Self-pay | Admitting: Critical Care Medicine

## 2021-01-16 ENCOUNTER — Other Ambulatory Visit: Payer: Self-pay

## 2021-01-16 ENCOUNTER — Encounter (HOSPITAL_COMMUNITY): Payer: Self-pay

## 2021-01-16 ENCOUNTER — Ambulatory Visit (HOSPITAL_COMMUNITY)
Admission: EM | Admit: 2021-01-16 | Discharge: 2021-01-16 | Disposition: A | Discharge status: Home / Self Care | Payer: Self-pay | Attending: Physician Assistant | Admitting: Physician Assistant

## 2021-01-16 DIAGNOSIS — R4189 Other symptoms and signs involving cognitive functions and awareness: Secondary | ICD-10-CM

## 2021-01-16 DIAGNOSIS — R460 Very low level of personal hygiene: Secondary | ICD-10-CM

## 2021-01-16 DIAGNOSIS — R4689 Other symptoms and signs involving appearance and behavior: Secondary | ICD-10-CM

## 2021-01-16 DIAGNOSIS — H538 Other visual disturbances: Secondary | ICD-10-CM

## 2021-01-16 LAB — POCT URINALYSIS DIPSTICK, ED / UC
Bilirubin Urine: NEGATIVE
Glucose, UA: NEGATIVE mg/dL
Hgb urine dipstick: NEGATIVE
Ketones, ur: NEGATIVE mg/dL
Leukocytes,Ua: NEGATIVE
Nitrite: NEGATIVE
Protein, ur: NEGATIVE mg/dL
Specific Gravity, Urine: 1.02 (ref 1.005–1.030)
Urobilinogen, UA: 0.2 mg/dL (ref 0.0–1.0)
pH: 7 (ref 5.0–8.0)

## 2021-01-16 LAB — CBG MONITORING, ED: Glucose-Capillary: 75 mg/dL (ref 70–99)

## 2021-01-16 NOTE — Discharge Instructions (Addendum)
Urine was normal as was blood sugar.  Vision is slightly decreased so I would recommend following up with an ophthalmologist to determine if glasses prescription is appropriate or if this needs to be adjusted.  Unfortunately, I do not have a great answer for the problems you are having.  It sounds as though he likely needs therapy and/or more social support but this will need to be arranged through primary care provider.  Please establish with PCP to initiate the services.  If you have any additional concerns or worsening symptoms please return for reevaluation.

## 2021-01-16 NOTE — ED Provider Notes (Signed)
[facility]    CSN: [id number] Arrival date & time: 01/16/21  1518      History   Chief Complaint Chief Complaint  Patient presents with   Blurred Vision    HPI Brent [name] is a 45 y.o. male.   Patient presents today accompanied by his aunt who provides majority of history.  Patient has a history of cognitive impairment has been living with aunt for a while in order to work part-time at a [employer] after grandparents who are his primary caregiver passed away.  She reports that recently he has had some behavioral changes and she has noticed that he is talking to himself more frequently.  Denies any known auditory or visual hallucinations and patient declines any of the symptoms.  He does report increased blurred vision but had glasses changed several months ago and she is unsure if it is that he has not adjusted or if there is an underlying issue.  Reports that she will often point things out and he will have difficulty following what she is talking about and seeing this.  An example was that she sent him out side to get a cat and he could not see the cat despite it being perched on the fence.  She also reports he has had some difficulty at his trauma and has been not paying attention to things that are being asked of him.  He will often not let her know that he is out of grooming products and just not brushes teeth or take showers.  She is unsure if this is just related to personality or if there is an underlying issue.  Does report that he was told that he was prediabetic in the past and so she is interested in having his blood sugar checked today.   Past Medical History:  Diagnosis Date   Anxiety    Hepatitis B     Patient Active Problem List   Diagnosis Date Noted   WEIGHT LOSS 02/18/2009    Past Surgical History:  Procedure Laterality Date   NO PAST SURGERIES         Home Medications    Prior to Admission medications   Not on File    Family  History Family History  Problem Relation Age of Onset   Ovarian cancer Paternal Grandmother    Prostate cancer Paternal Grandfather     Social History Social History   Tobacco Use   Smoking status: Never   Smokeless tobacco: Never  Vaping Use   Vaping Use: Never used  Substance Use Topics   Alcohol use: Yes   Drug use: No     Allergies   Patient has no known allergies.   Review of Systems Review of Systems  Constitutional:  Negative for activity change, appetite change, fatigue and fever.  Eyes:  Positive for visual disturbance. Negative for photophobia.  Respiratory:  Negative for cough and shortness of breath.   Cardiovascular:  Negative for chest pain.  Gastrointestinal:  Negative for abdominal pain, diarrhea, nausea and vomiting.  Neurological:  Negative for dizziness, light-headedness and headaches.  Psychiatric/Behavioral:  Positive for behavioral problems and decreased concentration. Negative for hallucinations, self-injury, sleep disturbance and suicidal ideas. The patient is not nervous/anxious.     Physical Exam Triage Vital Signs ED Triage Vitals  Enc Vitals Group     BP 01/16/21 1604 123/78     Pulse Rate 01/16/21 1604 80     Resp 01/16/21 1604 18  Temp 01/16/21 1604 98.1 F (36.7 C)     Temp Source 01/16/21 1604 Oral     SpO2 01/16/21 1604 96 %     Weight --      Height --      Head Circumference --      Peak Flow --      Pain Score 01/16/21 1603 0     Pain Loc --      Pain Edu? --      Excl. in GC? --    No data found.  Updated Vital Signs BP 123/78 (BP Location: Right Arm)   Pulse 80   Temp 98.1 F (36.7 C) (Oral)   Resp 18   SpO2 96%   Visual Acuity Right Eye Distance: 20/50 Left Eye Distance: 20/70 Bilateral Distance: 20/40 (without eyeglasses)  Right Eye Near:   Left Eye Near:    Bilateral Near:     Physical Exam Vitals reviewed.  Constitutional:      General: He is awake.     Appearance: Normal appearance. He is  normal weight. He is not ill-appearing.     Comments: Very pleasant male appears in age no acute distress sitting comfortably in exam room  HENT:     Head: Normocephalic and atraumatic.     Right Ear: Tympanic membrane, ear canal and external ear normal. Tympanic membrane is not erythematous or bulging.     Left Ear: Tympanic membrane, ear canal and external ear normal. Tympanic membrane is not erythematous or bulging.     Nose: Nose normal.     Mouth/Throat:     Pharynx: Uvula midline. No oropharyngeal exudate or posterior oropharyngeal erythema.  Eyes:     Extraocular Movements: Extraocular movements intact.     Conjunctiva/sclera: Conjunctivae normal.     Pupils: Pupils are equal, round, and reactive to light.  Cardiovascular:     Rate and Rhythm: Normal rate and regular rhythm.     Heart sounds: Normal heart sounds, S1 normal and S2 normal. No murmur heard. Pulmonary:     Effort: Pulmonary effort is normal. No accessory muscle usage or respiratory distress.     Breath sounds: Normal breath sounds. No stridor. No wheezing, rhonchi or rales.     Comments: Clear to auscultation bilaterally Lymphadenopathy:     Head:     Right side of head: No submental, submandibular or tonsillar adenopathy.     Left side of head: No submental, submandibular or tonsillar adenopathy.     Cervical: No cervical adenopathy.  Neurological:     Mental Status: He is alert.     Cranial Nerves: Cranial nerves are intact.     Motor: Motor function is intact.     Coordination: Coordination is intact.     Gait: Gait is intact.     Comments: Cranial nerves II through XII intact.  Psychiatric:        Attention and Perception: Attention normal.        Mood and Affect: Mood and affect normal.        Behavior: Behavior normal. Behavior is cooperative.        Thought Content: Thought content does not include homicidal or suicidal plan.     UC Treatments / Results  Labs (all labs ordered are listed, but only  abnormal results are displayed) Labs Reviewed  CBG MONITORING, ED  POCT URINALYSIS DIPSTICK, ED / UC    EKG   Radiology No results found.  Procedures Procedures (including critical care  time)  Medications Ordered in UC Medications - No data to display  Initial Impression / Assessment and Plan / UC Course  I have reviewed the triage vital signs and the nursing notes.  Pertinent labs & imaging results that were available during my care of the patient were reviewed by me and considered in my medical decision making (see chart for details).     It was concerned that patient may be developing diabetes.  Fasting blood sugar was obtained and was normal at 75.  UA was normal with no abnormalities.  Vision was abnormal we discussed that it is hard to tell if this is chronic or related to glasses prescription.  Encouraged him to follow-up with an ophthalmologist for further evaluation and management and to ensure prescription is accurate.  Discussed that he will likely need more social support including vocational training and this would need to be arranged through primary care.  We will place referral for primary care via PCP assistance but aunt was encouraged to follow-up with insurance to determine in network providers.  Discussed that if he has any worsening symptoms particularly if he develops any hallucinations or thoughts of suicide/self-harm he needs to be evaluated immediately.  Discussed alarm symptoms that warrant emergent evaluation.  Strict return precautions given to which and expressed understanding.  Final Clinical Impressions(s) / UC Diagnoses   Final diagnoses:  Cognitive impairment  Blurred vision  Behavioral change  Poor hygiene     Discharge Instructions      Urine was normal as was blood sugar.  Vision is slightly decreased so I would recommend following up with an ophthalmologist to determine if glasses prescription is appropriate or if this needs to be adjusted.   Unfortunately, I do not have a great answer for the problems you are having.  It sounds as though he likely needs therapy and/or more social support but this will need to be arranged through primary care provider.  Please establish with PCP to initiate the services.  If you have any additional concerns or worsening symptoms please return for reevaluation.     ED Prescriptions   None    PDMP not reviewed this encounter.   Jeani Hawking, PA-C 01/16/21 1743

## 2021-01-16 NOTE — ED Triage Notes (Signed)
Per family member pt is having blurry vision x 2 months. Per family member, pt changed his glasses 3 months ago.   Per family member pt is talking to himself and the cat for the past 3 months. Per aunt, pt  denies he talks to himself when she confronts him.   Pt aunts requested pt blood sugar to be checked, as he was told he is borderline diabetic.

## 2021-01-30 ENCOUNTER — Encounter: Payer: Self-pay | Admitting: Critical Care Medicine

## 2021-01-30 ENCOUNTER — Other Ambulatory Visit: Payer: Self-pay

## 2021-01-30 ENCOUNTER — Ambulatory Visit: Payer: Self-pay | Attending: Critical Care Medicine | Admitting: Critical Care Medicine

## 2021-01-30 VITALS — BP 121/86 | HR 71 | Resp 16 | Wt 138.8 lb

## 2021-01-30 DIAGNOSIS — Z139 Encounter for screening, unspecified: Secondary | ICD-10-CM

## 2021-01-30 DIAGNOSIS — F09 Unspecified mental disorder due to known physiological condition: Secondary | ICD-10-CM

## 2021-01-30 DIAGNOSIS — F419 Anxiety disorder, unspecified: Secondary | ICD-10-CM

## 2021-01-30 DIAGNOSIS — Z23 Encounter for immunization: Secondary | ICD-10-CM

## 2021-01-30 DIAGNOSIS — Z131 Encounter for screening for diabetes mellitus: Secondary | ICD-10-CM

## 2021-01-30 DIAGNOSIS — F32A Depression, unspecified: Secondary | ICD-10-CM

## 2021-01-30 DIAGNOSIS — Z114 Encounter for screening for human immunodeficiency virus [HIV]: Secondary | ICD-10-CM

## 2021-01-30 MED ORDER — SERTRALINE HCL 50 MG PO TABS
50.0000 mg | ORAL_TABLET | Freq: Every day | ORAL | 3 refills | Status: DC
  Filled 2021-01-30 – 2021-06-20 (×4): qty 30, 30d supply, fill #0
  Filled 2021-09-17: qty 30, 30d supply, fill #1
  Filled 2021-12-16: qty 30, 30d supply, fill #2
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
## 2021-01-30 NOTE — Assessment & Plan Note (Signed)
This patient is given a longstanding history of progressive cognitive and behavioral dysfunction.  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  His job performance is not optimal even at his level.  He finished high school but appears to be forming at a lower level.  No pre-existing mental health conditions or mental limitation conditions.  I would he would ideally benefit from a neuropsych eval but given his lack of insurance we will try to achieve the orange card [place] discount ASAP and in the meantime refer him to neurology for further evaluation also we will have him go to the [facility] behavioral health center for mental health evaluation as well.  For his mild depression and anxiety I will prescribe sertraline 50 mg daily.  Complete set of health screening labs were sent as well today.  The aunt wanted me to sign a letter stating he could not participate in jury duty however I cannot do that because his functionality is high enough that it would be very unlikely he would be excused from these duties

## 2021-01-30 NOTE — Patient Instructions (Signed)
Complete set of health screening labs will be obtained  Flu vaccine and tetanus vaccine was given  Referral to neurology was made and please get the orange card application and processed  Begin sertraline 50 mg daily to help with anxiety and depression  Referral to [facility] our licensed clinical social worker for behavioral therapy will be made  Please go to the [facility] Monday through Thursday for open access day which is between 8 and 11 AM.  If you arrive by 7:30 AM it is walk-in first come first serve care and you will likely be seen early.  This is for a behavioral health evaluation  Return to see Dr. Delford Field 3  months

## 2021-01-30 NOTE — Assessment & Plan Note (Addendum)
Mild anxiety and depression plan will be to prescribe sertraline 50 mg daily and refer to [facility] behavioral health  Will also have licensed clinical social worker connect with him as well

## 2021-01-31 ENCOUNTER — Telehealth: Payer: Self-pay

## 2021-01-31 NOTE — Telephone Encounter (Signed)
Contacted pt to go over lab results pt didn't answer voicemail box has not been set up    Sent a CRM and forward labs to NT to give pt labs when they call back

## 2021-02-03 LAB — CBC WITH DIFFERENTIAL/PLATELET
Basophils Absolute: 0.1 10*3/uL (ref 0.0–0.2)
Basos: 1 %
EOS (ABSOLUTE): 0.2 10*3/uL (ref 0.0–0.4)
Eos: 5 %
Hematocrit: 41.9 % (ref 37.5–51.0)
Hemoglobin: 14.3 g/dL (ref 13.0–17.7)
Immature Grans (Abs): 0 10*3/uL (ref 0.0–0.1)
Immature Granulocytes: 0 %
Lymphocytes Absolute: 1 10*3/uL (ref 0.7–3.1)
Lymphs: 21 %
MCH: 28.6 pg (ref 26.6–33.0)
MCHC: 34.1 g/dL (ref 31.5–35.7)
MCV: 84 fL (ref 79–97)
Monocytes Absolute: 0.4 10*3/uL (ref 0.1–0.9)
Monocytes: 9 %
Neutrophils Absolute: 3.1 10*3/uL (ref 1.4–7.0)
Neutrophils: 64 %
Platelets: 168 10*3/uL (ref 150–450)
RBC: 5 x10E6/uL (ref 4.14–5.80)
RDW: 11.9 % (ref 11.6–15.4)
WBC: 4.9 10*3/uL (ref 3.4–10.8)

## 2021-02-03 LAB — CMP14+EGFR
ALT: 15 IU/L (ref 0–44)
AST: 19 IU/L (ref 0–40)
Albumin/Globulin Ratio: 1.9 (ref 1.2–2.2)
Albumin: 4.6 g/dL (ref 4.0–5.0)
Alkaline Phosphatase: 74 IU/L (ref 44–121)
BUN/Creatinine Ratio: 16 (ref 9–20)
BUN: 15 mg/dL (ref 6–24)
Bilirubin Total: 0.4 mg/dL (ref 0.0–1.2)
CO2: 27 mmol/L (ref 20–29)
Calcium: 9.6 mg/dL (ref 8.7–10.2)
Chloride: 103 mmol/L (ref 96–106)
Creatinine, Ser: 0.93 mg/dL (ref 0.76–1.27)
Globulin, Total: 2.4 g/dL (ref 1.5–4.5)
Glucose: 59 mg/dL — ABNORMAL LOW (ref 65–99)
Potassium: 4.1 mmol/L (ref 3.5–5.2)
Sodium: 142 mmol/L (ref 134–144)
Total Protein: 7 g/dL (ref 6.0–8.5)
eGFR: 104 mL/min/{1.73_m2} (ref >59)

## 2021-02-03 LAB — VITAMIN B1: Thiamine: 147.1 nmol/L (ref 66.5–200.0)

## 2021-02-03 LAB — FOLATE: Folate: 15 ng/mL (ref >3.0)

## 2021-02-03 LAB — HEMOGLOBIN A1C
Est. average glucose Bld gHb Est-mCnc: 120 mg/dL
Hgb A1c MFr Bld: 5.8 % — ABNORMAL HIGH (ref 4.8–5.6)

## 2021-02-03 LAB — THYROID PANEL WITH TSH
Free Thyroxine Index: 2 (ref 1.2–4.9)
T3 Uptake Ratio: 27 % (ref 24–39)
T4, Total: 7.4 ug/dL (ref 4.5–12.0)
TSH: 1.06 u[IU]/mL (ref 0.450–4.500)

## 2021-02-03 LAB — VITAMIN D 25 HYDROXY (VIT D DEFICIENCY, FRACTURES): Vit D, 25-Hydroxy: 30 ng/mL (ref 30.0–100.0)

## 2021-02-03 LAB — HIV ANTIBODY (ROUTINE TESTING W REFLEX): HIV Screen 4th Generation wRfx: NONREACTIVE

## 2021-02-03 LAB — VITAMIN B12: Vitamin B-12: 1083 pg/mL (ref 232–1245)

## 2021-02-05 ENCOUNTER — Encounter: Payer: Self-pay | Admitting: Neurology

## 2021-02-17 ENCOUNTER — Ambulatory Visit: Payer: Self-pay | Admitting: Neurology

## 2021-02-21 ENCOUNTER — Other Ambulatory Visit: Payer: Self-pay

## 2021-03-05 ENCOUNTER — Ambulatory Visit: Payer: Self-pay | Admitting: Neurology

## 2021-03-07 ENCOUNTER — Ambulatory Visit: Payer: Self-pay

## 2021-03-10 ENCOUNTER — Other Ambulatory Visit: Payer: Self-pay

## 2021-03-11 ENCOUNTER — Ambulatory Visit (INDEPENDENT_AMBULATORY_CARE_PROVIDER_SITE_OTHER): Payer: Self-pay | Admitting: Neurology

## 2021-03-11 ENCOUNTER — Encounter: Payer: Self-pay | Admitting: Neurology

## 2021-03-11 ENCOUNTER — Other Ambulatory Visit: Payer: Self-pay

## 2021-03-11 VITALS — BP 123/81 | HR 79 | Ht 72.0 in | Wt 134.2 lb

## 2021-03-11 DIAGNOSIS — F89 Unspecified disorder of psychological development: Secondary | ICD-10-CM

## 2021-03-11 DIAGNOSIS — F79 Unspecified intellectual disabilities: Secondary | ICD-10-CM

## 2021-03-11 NOTE — Progress Notes (Signed)
NEUROLOGY CONSULTATION NOTE  Brent [name] MRN: [id number] DOB: 08-09-1975  Referring provider: Dr. Shan Levans Primary care provider: Dr. Shan Levans  Reason for consult:  cognitive and behavioral dysfunction  Dear Dr Delford Field:  Thank you for your kind referral of Brent [name] for consultation of the above symptoms. Although his history is well known to you, please allow me to reiterate it for the purpose of our medical record. The patient was accompanied to the clinic by his aunt [name] who also provides collateral information. Records and images were personally reviewed where available.   HISTORY OF PRESENT ILLNESS: This is a 45 year old right-handed man with a history of intellectual disability, anxiety and depression, presenting for evaluation of cognitive and behavioral changes. He was in the ER on 01/16/2021 for behavioral changes, talking to himself more frequently. He has been living with his aunt for the past 15 years after his grandparents who were his primary caregivers since he was 45 years old passed away. His aunt reports that she is confused about his behaviors. He had been working at [employer] for 1.5 years and apparently had been taking food from the store. He was leaving work for other people to do. She talked to him and told him many times to stop but he still ended up doing it. She states she is confused why he steals food when he has access to good food at home. She took him to work daily and monitored what he was doing, but despite that "something happened there, I don't know what" and he was fired last month. She states he was not doing his work like he was told to do. She also reports that he worked at [employer] 10 years ago and had a similar incident, "he did something crazy like that." She reports he had a "so-called job coach" to help him but she does not think she is helping. He also has similar behaviors at home, he takes things and denies he took them. She  reports "he did some things when living with his grandmother, nobody knew he was doing this stuff." She reports hygiene issues, he is independent with dressing and bathing but does not keep his clothes folded and wears dirty clothes. If he runs out of toothpaste, he does not brush his teeth.She brought him to the ER on 01/16/21 for blurred vision. He reported difficulty with vision, his aunt would point things out and he has difficulty following what he is talking about, for instance she sent him outside to get a cat and he did not see it perched on the fence. His aunt reported he has had difficulty at his trauma and has not been paying attention to things asked of him. He was evaluated by Dr. Delford Field, bloodwork including TSH, B12, B1, HbA1c, HIV were normal. He denies any headaches, dizziness, diplopia, dysarthria/dysphagia, neck/back pain, focal numbness/tingling/weakness, bowel/bladder dysfunction. Sleep is good. He feels his memory is good. His aunt administers his medications. She reports he talks to himself a lot, but he says he is not talking to himself. He denies any hallucinations. He finished 12th grade, "sometimes in special education classes."     PAST MEDICAL HISTORY: Past Medical History:  Diagnosis Date   Anxiety    Depression    Hepatitis B     PAST SURGICAL HISTORY: Past Surgical History:  Procedure Laterality Date   NO PAST SURGERIES      MEDICATIONS: Current Outpatient Medications on File Prior to Visit  Medication Sig Dispense Refill   sertraline (ZOLOFT) 50 MG tablet Take 1 tablet (50 mg total) by mouth daily. 30 tablet 3   No current facility-administered medications on file prior to visit.    ALLERGIES: No Known Allergies  FAMILY HISTORY: Family History  Problem Relation Age of Onset   Diabetes Father    Hypertension Father    Ovarian cancer Paternal Grandmother    Prostate cancer Paternal Grandfather     SOCIAL HISTORY: Social History   Socioeconomic  History   Marital status: Single    Spouse name: Not on file   Number of children: 0   Years of education: Not on file   Highest education level: Not on file  Occupational History   Occupation: unemployed  Tobacco Use   Smoking status: Never   Smokeless tobacco: Never  Vaping Use   Vaping Use: Never used  Substance and Sexual Activity   Alcohol use: Never   Drug use: No   Sexual activity: Never  Other Topics Concern   Not on file  Social History Narrative   Not on file   Social Determinants of Health   Financial Resource Strain: Not on file  Food Insecurity: Not on file  Transportation Needs: Not on file  Physical Activity: Not on file  Stress: Not on file  Social Connections: Not on file  Intimate Partner Violence: Not on file     PHYSICAL EXAM: Vitals:   03/11/21 1055  BP: 123/81  Pulse: 79  SpO2: 99%   General: No acute distress Head:  Normocephalic/atraumatic Skin/Extremities: No rash, no edema Neurological Exam: Mental status: alert and oriented to person, place, and time, no dysarthria or aphasia, Fund of knowledge is appropriate.  Recent and remote memory are impaired.  Attention and concentration are reduced.  Able to name objects and repeat phrases. MMSE 22/30 MMSE - Mini Mental State Exam 03/23/2021  Orientation to time 4  Orientation to Place 4  Registration 3  Attention/ Calculation 2  Recall 1  Language- name 2 objects 2  Language- repeat 1  Language- follow 3 step command 3  Language- read & follow direction 1  Write a sentence 1  Copy design 0  Total score 22    Cranial nerves: CN I: not tested CN II: pupils equal, round and reactive to light, visual fields intact CN III, IV, VI:  full range of motion, no nystagmus, no ptosis CN V: facial sensation intact CN VII: upper and lower face symmetric CN VIII: hearing intact to conversation Bulk & Tone: normal, no fasciculations. Motor: 5/5 throughout with no pronator drift. Sensation:  intact to light touch, cold, pin, vibration sense.  No extinction to double simultaneous stimulation.  Romberg test negative Deep Tendon Reflexes: +2 throughout Cerebellar: no incoordination on finger to nose testing Gait: narrow-based and steady, able to tandem walk adequately. Tremor: none   IMPRESSION: This is a 45 year old right-handed man with a history of intellectual disability, anxiety and depression, presenting for evaluation of cognitive and behavioral changes. His neurological exam is non-focal, MMSE today 22/30. After reviewing his aunt's concerns, it appears she has difficulties with his underlying intellectual disability and accompanying behaviors. Discussed that persons with intellectual disability have limits to their ability to learn at an expected level and function in daily life. Resources for Vocational Rehab provided, as well as [employer] to help with management of behaviors. Follow-up as needed, they know to call for any changes.    Thank you  for allowing me to participate in the care of this patient. Please do not hesitate to call for any questions or concerns.   Patrcia Dolly, M.D.  CC: Dr. Delford Field

## 2021-03-11 NOTE — Patient Instructions (Signed)
Contact Vocational Rehab  [phone]  2. Recommend going to [facility] [address]  3. Follow-up as needed

## 2021-03-18 ENCOUNTER — Other Ambulatory Visit: Payer: Self-pay

## 2021-03-18 ENCOUNTER — Ambulatory Visit: Payer: Self-pay | Attending: Internal Medicine

## 2021-03-18 DIAGNOSIS — Z23 Encounter for immunization: Secondary | ICD-10-CM

## 2021-03-18 MED ORDER — PFIZER COVID-19 VAC BIVALENT 30 MCG/0.3ML IM SUSP
INTRAMUSCULAR | 0 refills | Status: DC
  Filled 2021-03-18: qty 0.3, 1d supply, fill #0

## 2021-03-18 NOTE — Progress Notes (Signed)
   Covid-19 Vaccination Clinic  Name:  Brent [name]    MRN: [id number] DOB: Feb 07, 1976  03/18/2021  [name] was observed post Covid-19 immunization for 15 minutes without incident. He was provided with Vaccine Information Sheet and instruction to access the V-Safe system.   [name] was instructed to call 911 with any severe reactions post vaccine: Difficulty breathing  Swelling of face and throat  A fast heartbeat  A bad rash all over body  Dizziness and weakness   Immunizations Administered     Name Date Dose VIS Date Route   Pfizer Covid-19 Vaccine Bivalent Booster 03/18/2021 10:11 AM 0.3 mL 01/15/2021 Intramuscular   Manufacturer: [employer], [employer]   Lot: [id number]   NDC: [address]-2      Drusilla Kanner, PharmD, MBA Clinical Acute Care Pharmacist

## 2021-03-19 ENCOUNTER — Other Ambulatory Visit: Payer: Self-pay

## 2021-03-26 ENCOUNTER — Ambulatory Visit (HOSPITAL_COMMUNITY)
Admission: EM | Admit: 2021-03-26 | Discharge: 2021-03-26 | Disposition: A | Discharge status: Home / Self Care | Payer: Self-pay | Attending: Family Medicine | Admitting: Family Medicine

## 2021-03-26 ENCOUNTER — Encounter (HOSPITAL_COMMUNITY): Payer: Self-pay | Admitting: Emergency Medicine

## 2021-03-26 ENCOUNTER — Other Ambulatory Visit: Payer: Self-pay

## 2021-03-26 DIAGNOSIS — K209 Esophagitis, unspecified without bleeding: Secondary | ICD-10-CM

## 2021-03-26 DIAGNOSIS — Z20822 Contact with and (suspected) exposure to covid-19: Secondary | ICD-10-CM

## 2021-03-26 MED ORDER — FAMOTIDINE 40 MG PO TABS: 40.0000 mg | ORAL_TABLET | Freq: Two times a day (BID) | ORAL | 0 refills | Status: DC

## 2021-03-26 NOTE — Discharge Instructions (Addendum)
Your results will be able tomorrow after 5 pm.

## 2021-03-26 NOTE — ED Provider Notes (Signed)
[facility]    CSN: [id number] Arrival date & time: 03/26/21  1421      History   Chief Complaint Chief Complaint  Patient presents with   Sore Throat    HPI Brent [name] is a 45 y.o. male.   HPI  Patient presents today for evaluation of shortness of breath x3 days.  Patient has a low-grade temperature of 99.2.  He endorses no associated symptoms .  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  Patient endorses pain is only present with swallowing.  He endorses a sensation of food being stuck in his throat at times.  Denies any nausea, vomiting or frequently clearing of throat.  No known history of acid reflux.   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 Authorizing Provider  COVID-19 mRNA bivalent vaccine, Pfizer, (PFIZER COVID-19 VAC BIVALENT) injection Inject into the muscle. 03/18/21   Judyann Munson, MD  sertraline (ZOLOFT) 50 MG tablet Take 1 tablet (50 mg total) by mouth daily. 01/30/21   Storm Frisk, MD    Family History Family History  Problem Relation Age of Onset   Diabetes Father    Hypertension Father    Ovarian cancer Paternal Grandmother    Prostate cancer Paternal Grandfather     Social History Social History   Tobacco Use   Smoking status: Never   Smokeless tobacco: Never  Vaping Use   Vaping Use: Never used  Substance Use Topics   Alcohol use: Never   Drug use: No     Allergies   Patient has no known allergies.   Review of Systems Review of Systems Pertinent negatives listed in HPI  Physical Exam Triage  Vital Signs ED Triage Vitals  Enc Vitals Group     BP 03/26/21 1604 121/64     Pulse Rate 03/26/21 1604 85     Resp 03/26/21 1604 15     Temp 03/26/21 1604 99.2 F (37.3 C)     Temp Source 03/26/21 1604 Oral     SpO2 03/26/21 1604 97 %     Weight --      Height --      Head Circumference --      Peak Flow --      Pain Score 03/26/21 1602 6     Pain Loc --      Pain Edu? --      Excl. in GC? --    No data found.  Updated Vital Signs BP 121/64 (BP Location: Left Arm)   Pulse 85   Temp 99.2 F (37.3 C) (Oral)   Resp 15   SpO2 97%   Visual Acuity Right Eye Distance:   Left Eye Distance:   Bilateral Distance:    Right Eye Near:   Left Eye Near:    Bilateral Near:     Physical Exam Vitals reviewed.  Constitutional:      Appearance: He is well-developed and normal weight.  HENT:     Head:  Normocephalic and atraumatic.     Right Ear: Tympanic membrane normal.     Left Ear: Tympanic membrane normal.     Mouth/Throat:     Mouth: Mucous membranes are moist. Mucous membranes are pale.     Tonsils: 0 on the right. 0 on the left.  Eyes:     Conjunctiva/sclera: Conjunctivae normal.     Pupils: Pupils are equal, round, and reactive to light.  Cardiovascular:     Rate and Rhythm: Normal rate and regular rhythm.  Musculoskeletal:     Cervical back: Normal range of motion.  Lymphadenopathy:     Cervical: Cervical adenopathy present.  Skin:    General: Skin is warm and dry.     Capillary Refill: Capillary refill takes less than 2 seconds.  Neurological:     General: No focal deficit present.     Mental Status: He is alert.   UC Treatments / Results  Labs (all labs ordered are listed, but only abnormal results are displayed) Labs Reviewed - No data to display  EKG   Radiology No results found.  Procedures Procedures (including critical care time)  Medications Ordered in UC Medications - No data to display  Initial Impression / Assessment and Plan / UC  Course  I have reviewed the triage vital signs and the nursing notes.  Pertinent labs & imaging results that were available during my care of the patient were reviewed by me and considered in my medical decision making (see chart for details).    Symptoms are consistent with that of an acute esophagitis however patient is accompanied by his caregiver and we discussed the possibility of exposure to COVID therefore will obtain a COVID test while patient is here in clinic.  We will treat for suspected acute esophagitis with Pepcid 40 mg twice daily x5 days.  Encourage hydration with fluids.  Avoid irritants such as caffeine and spicy foods. Patient is not active on my chart therefore advised to call back after 5 PM tomorrow to obtain COVID results. Final Clinical Impressions(s) / UC Diagnoses   Final diagnoses:  Encounter for screening laboratory testing for COVID-19 virus  Acute esophagitis   Discharge Instructions   None    ED Prescriptions     Medication Sig Dispense Auth. Provider   famotidine (PEPCID) 40 MG tablet Take 1 tablet (40 mg total) by mouth 2 (two) times daily for 5 days. 10 tablet Bing Neighbors, FNP      PDMP not reviewed this encounter.   Bing Neighbors, FNP 03/26/21 [phone]

## 2021-03-26 NOTE — ED Triage Notes (Signed)
Pt c/o sore throat for a couple days.

## 2021-03-27 LAB — SARS CORONAVIRUS 2 (TAT 6-24 HRS): SARS Coronavirus 2: NEGATIVE

## 2021-03-31 ENCOUNTER — Ambulatory Visit: Payer: Self-pay | Admitting: *Deleted

## 2021-03-31 ENCOUNTER — Emergency Department (HOSPITAL_COMMUNITY): Payer: Self-pay

## 2021-03-31 ENCOUNTER — Emergency Department (HOSPITAL_COMMUNITY)
Admission: EM | Admit: 2021-03-31 | Discharge: 2021-03-31 | Disposition: A | Discharge status: Home / Self Care | Payer: Self-pay | Attending: Emergency Medicine | Admitting: Emergency Medicine

## 2021-03-31 DIAGNOSIS — J029 Acute pharyngitis, unspecified: Secondary | ICD-10-CM | POA: Insufficient documentation

## 2021-03-31 DIAGNOSIS — Z20822 Contact with and (suspected) exposure to covid-19: Secondary | ICD-10-CM | POA: Insufficient documentation

## 2021-03-31 LAB — CBC WITH DIFFERENTIAL/PLATELET
Abs Immature Granulocytes: 0.07 10*3/uL (ref 0.00–0.07)
Basophils Absolute: 0 10*3/uL (ref 0.0–0.1)
Basophils Relative: 0 %
Eosinophils Absolute: 0 10*3/uL (ref 0.0–0.5)
Eosinophils Relative: 0 %
HCT: 40.3 % (ref 39.0–52.0)
Hemoglobin: 13.1 g/dL (ref 13.0–17.0)
Immature Granulocytes: 1 %
Lymphocytes Relative: 4 %
Lymphs Abs: 0.5 10*3/uL — ABNORMAL LOW (ref 0.7–4.0)
MCH: 29 pg (ref 26.0–34.0)
MCHC: 32.5 g/dL (ref 30.0–36.0)
MCV: 89.2 fL (ref 80.0–100.0)
Monocytes Absolute: 1.4 10*3/uL — ABNORMAL HIGH (ref 0.1–1.0)
Monocytes Relative: 11 %
Neutro Abs: 11.2 10*3/uL — ABNORMAL HIGH (ref 1.7–7.7)
Neutrophils Relative %: 84 %
Platelets: 244 10*3/uL (ref 150–400)
RBC: 4.52 MIL/uL (ref 4.22–5.81)
RDW: 13 % (ref 11.5–15.5)
WBC: 13.3 10*3/uL — ABNORMAL HIGH (ref 4.0–10.5)
nRBC: 0 % (ref 0.0–0.2)

## 2021-03-31 LAB — GROUP A STREP BY PCR: Group A Strep by PCR: NOT DETECTED

## 2021-03-31 LAB — BASIC METABOLIC PANEL
Anion gap: 10 (ref 5–15)
BUN: 18 mg/dL (ref 6–20)
CO2: 28 mmol/L (ref 22–32)
Calcium: 9.5 mg/dL (ref 8.9–10.3)
Chloride: 99 mmol/L (ref 98–111)
Creatinine, Ser: 1.02 mg/dL (ref 0.61–1.24)
GFR, Estimated: >60 mL/min (ref >60)
Glucose, Bld: 130 mg/dL — ABNORMAL HIGH (ref 70–99)
Potassium: 3.9 mmol/L (ref 3.5–5.1)
Sodium: 137 mmol/L (ref 135–145)

## 2021-03-31 LAB — RESP PANEL BY RT-PCR (FLU A&B, COVID) ARPGX2
Influenza A by PCR: NEGATIVE
Influenza B by PCR: NEGATIVE
SARS Coronavirus 2 by RT PCR: NEGATIVE

## 2021-03-31 MED ORDER — PENICILLIN V POTASSIUM 500 MG PO TABS: 500.0000 mg | ORAL_TABLET | Freq: Four times a day (QID) | ORAL | 0 refills | Status: AC

## 2021-03-31 NOTE — ED Provider Notes (Signed)
Emergency Medicine Provider Triage Evaluation Note  Brent [name] , a 45 y.o. male  was evaluated in triage.  Pt complains of shortness of breath and difficulty swallowing since Thursday.  Mother bedside states the patient does not often complain, and is not often ill, so she urgently brought him to urgent care.  He is complaining of sore throat, difficulty breathing, as well as painful swallowing.  Review of Systems  Positive: Shortness of breath, difficulty swallowing Negative: Chest pain, fevers  Physical Exam  BP 120/84   Pulse 99   Temp 98.8 F (37.1 C) (Oral)   Resp 15   SpO2 99%  Gen:   Awake, no distress   Resp:  Normal effort  MSK:   Moves extremities without difficulty  Other:  Tolerating his own secretions  Medical Decision Making  Medically screening exam initiated at 1:24 PM.  Appropriate orders placed.  [name] was informed that the remainder of the evaluation will be completed by another provider, this initial triage assessment does not replace that evaluation, and the importance of remaining in the ED until their evaluation is complete.     Jeanella Flattery 03/31/21 1326    Milagros Loll, MD 04/01/21 2246

## 2021-03-31 NOTE — Discharge Instructions (Addendum)
You were seen in the emergency department for a sore throat.  Your COVID and flu test were negative.  Your lab work did not show any obvious explanation for your symptoms.  Your strep test is pending at time of discharge and you can follow this up in MyChart.  If you are strep positive please start the antibiotics.  Otherwise continue your Pepcid and you can do warm salt water gargles to help with the pain.  Follow-up with your doctor.  Return to the emergency department if any worsening or concerning symptoms

## 2021-03-31 NOTE — ED Triage Notes (Signed)
Pt here d/t sore throat. [facility] Was told to come to ED to be evaluated. No distress noted during triage.

## 2021-03-31 NOTE — Telephone Encounter (Signed)
Patient's aunt Platon Arocho calling on behalf of the patient, while patient is present at the time of call. Patient's aunt states that the patient was taken to UC on Friday and was diagnosed with esophagitis. Patient was given a prescription for famotidine but the aunt states that she does not think it has helped.Patient's aunt calling today because the patient has become worse since UC visit on Friday. During the call the patient states that he is experiencing difficulty breathing and the aunt states that the patient can barely talk or eat because of the pain. Patient's aunt advised that the patient needs to be seen in the ED now. Patient's aunt advised that 911 could be called for patient's symptoms but she states she is able to take him to the ED. Advised to call 911 if symptoms become worse. Understanding verbalized.    Reason for Disposition  Drooling or spitting out saliva (because can't swallow)  Answer Assessment - Initial Assessment Questions 1. RESPIRATORY STATUS: "Describe your breathing?" (e.g., wheezing, shortness of breath, unable to speak, severe coughing)      Can barely talk 2. ONSET: "When did this breathing problem begin?"      Was seen in the ED on Friday, but symptoms have become wor 3. PATTERN "Does the difficult breathing come and go, or has it been constant since it started?"      Has become worse 4. SEVERITY: "How bad is your breathing?" (e.g., mild, moderate, severe)    - MILD: No SOB at rest, mild SOB with walking, speaks normally in sentences, can lie down, no retractions, pulse < 100.    - MODERATE: SOB at rest, SOB with minimal exertion and prefers to sit, cannot lie down flat, speaks in phrases, mild retractions, audible wheezing, pulse 100-120.    - SEVERE: Very SOB at rest, speaks in single words, struggling to breathe, sitting hunched forward, retractions, pulse > 120       5. RECURRENT SYMPTOM: "Have you had difficulty breathing before?" If Yes, ask: "When was  the last time?" and "What happened that time?"       6. CARDIAC HISTORY: "Do you have any history of heart disease?" (e.g., heart attack, angina, bypass surgery, angioplasty)       7. LUNG HISTORY: "Do you have any history of lung disease?"  (e.g., pulmonary embolus, asthma, emphysema)      8. CAUSE: "What do you think is causing the breathing problem?"      Recently diagnosed with esophagitis 9. OTHER SYMPTOMS: "Do you have any other symptoms? (e.g., dizziness, runny nose, cough, chest pain, fever)     Unable to eat 10. O2 SATURATION MONITOR:  "Do you use an oxygen saturation monitor (pulse oximeter) at home?" If Yes, "What is your reading (oxygen level) today?" "What is your usual oxygen saturation reading?" (e.g., 95%)        11. PREGNANCY: "Is there any chance you are pregnant?" "When was your last menstrual period?"       N/a 12. TRAVEL: "Have you traveled out of the country in the last month?" (e.g., travel history, exposures)       N/a  Protocols used: Breathing Difficulty-A-AH

## 2021-03-31 NOTE — ED Provider Notes (Signed)
[facility] EMERGENCY DEPARTMENT Provider Note   CSN: [id number] Arrival date & time: 03/31/21  1205     History Chief Complaint  Patient presents with   Sore Throat    Brent [name] is a 45 y.o. male.  He is here with a complaint of sore throat has been going on for about 5 days.  Aunt is giving most of the history.  She says he has not been eating and drinking much due to his throat pain.  Causes him some shortness of breath.  Was seen at urgent care and put on some Pepcid which did not seem to be helping but today seems a little bit better.  No fevers or chills.  Patient currently denies any throat pain.  The history is provided by the patient.  Sore Throat This is a new problem. The current episode started more than 2 days ago. The problem occurs constantly. The problem has been gradually improving. Associated symptoms include shortness of breath. Pertinent negatives include no chest pain, no abdominal pain and no headaches. The symptoms are aggravated by swallowing. Nothing relieves the symptoms. He has tried rest for the symptoms. The treatment provided mild relief.      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 Authorizing Provider   famotidine (PEPCID) 40 MG tablet Take 1 tablet (40 mg total) by mouth 2 (two) times daily for 5 days. 03/26/21 03/31/21 Yes Bing Neighbors, FNP  Multiple Vitamin (MULTIVITAMIN ADULT) TABS Take 1 tablet by mouth daily.   Yes [provider]  sertraline (ZOLOFT) 50 MG tablet Take 1 tablet (50 mg total) by mouth daily. 01/30/21  Yes Storm Frisk, MD  COVID-19 mRNA bivalent vaccine, Pfizer, (PFIZER COVID-19 VAC BIVALENT) injection Inject into the muscle. 03/18/21   Judyann Munson, MD    Allergies    Patient has no known allergies.  Review of Systems   Review of Systems  Constitutional:  Negative for fever.  HENT:  Positive for sore throat.   Eyes:  Negative for visual disturbance.  Respiratory:  Positive for shortness of breath.   Cardiovascular:  Negative for chest pain.  Gastrointestinal:  Negative for abdominal pain.  Genitourinary:  Negative for dysuria.  Musculoskeletal:  Positive for neck pain.  Skin:  Negative for rash.  Neurological:  Negative for headaches.   Physical Exam Updated Vital Signs BP 117/88 (BP Location: Left Arm)   Pulse 71   Temp 98.8 F (37.1 C) (Oral)   Resp 17   SpO2 100%   Physical Exam Vitals and nursing note reviewed.  Constitutional:  Appearance: He is well-developed.  HENT:     Head: Normocephalic and atraumatic.     Nose: No congestion or rhinorrhea.     Mouth/Throat:     Pharynx: Uvula midline. Posterior oropharyngeal erythema present. No oropharyngeal exudate.     Tonsils: No tonsillar exudate or tonsillar abscesses.  Eyes:     Conjunctiva/sclera: Conjunctivae normal.  Cardiovascular:     Rate and Rhythm: Normal rate and regular rhythm.     Heart sounds: No murmur heard. Pulmonary:     Effort: Pulmonary effort is normal. No respiratory distress.     Breath sounds: Normal breath sounds.  Abdominal:     Palpations: Abdomen is soft.     Tenderness: There is no abdominal tenderness.  Musculoskeletal:     Cervical  back: Neck supple.  Skin:    General: Skin is warm and dry.  Neurological:     General: No focal deficit present.     Mental Status: He is alert.    ED Results / Procedures / Treatments   Labs (all labs ordered are listed, but only abnormal results are displayed) Labs Reviewed  CBC WITH DIFFERENTIAL/PLATELET - Abnormal; Notable for the following components:      Result Value   WBC 13.3 (*)    Neutro Abs 11.2 (*)    Lymphs Abs 0.5 (*)    Monocytes Absolute 1.4 (*)    All other components within normal limits  BASIC METABOLIC PANEL - Abnormal; Notable for the following components:   Glucose, Bld 130 (*)    All other components within normal limits  RESP PANEL BY RT-PCR (FLU A&B, COVID) ARPGX2  GROUP A STREP BY PCR    EKG EKG Interpretation  Date/Time:  Monday March 31 2021 13:06:48 EST Ventricular Rate:  98 PR Interval:  148 QRS Duration: 82 QT Interval:  306 QTC Calculation: 390 R Axis:   84 Text Interpretation: Normal sinus rhythm Right atrial enlargement Inferior infarct , age undetermined Abnormal ECG No old tracing to compare Confirmed by Aletta Edouard [id number]) on 03/31/2021 6:27:06 PM  Radiology DG Chest 2 View  Result Date: 03/31/2021 CLINICAL DATA:  Shortness of breath EXAM: CHEST - 2 VIEW COMPARISON:  Chest radiograph 05/16/2009 FINDINGS: The cardiomediastinal silhouette is normal. There is no focal consolidation or pulmonary edema. There is no pleural effusion or pneumothorax. The bones are unremarkable. IMPRESSION: No radiographic evidence of acute cardiopulmonary process. Electronically Signed   By: Valetta Mole M.D.   On: 03/31/2021 13:56    Procedures Procedures   Medications Ordered in ED Medications - No data to display  ED Course  I have reviewed the triage vital signs and the nursing notes.  Pertinent labs & imaging results that were available during my care of the patient were reviewed by me and considered in my medical decision making (see  chart for details).    MDM Rules/Calculators/A&P                          Brent [name] was evaluated in Emergency Department on 03/31/2021 for the symptoms described in the history of present illness. He was evaluated in the context of the global COVID-19 pandemic, which necessitated consideration that the patient might be at risk for infection with the SARS-CoV-2 virus that causes COVID-19. Institutional protocols and algorithms that pertain to the evaluation of patients at risk for COVID-19 are in a state of rapid change based on information released by regulatory bodies  including the CDC and federal and state organizations. These policies and algorithms were followed during the patient's care in the ED.   Final Clinical Impression(s) / ED Diagnoses Final diagnoses:  Acute pharyngitis, unspecified etiology    Rx / DC Orders ED Discharge Orders          Ordered    penicillin v potassium (VEETID) 500 MG tablet  4 times daily        03/31/21 1843             Hayden Rasmussen, MD 03/31/21 2251

## 2021-03-31 NOTE — ED Notes (Signed)
Pt complaining of shortness of breathe. Pt been drinking ensure and eating chicken noodle soup at home. Family member feels as if the pt is dehydrated due to him not eating. Family member also said that pt hasn't ate anything for something to be stuck in his throat to cause him the shortness of breathe.

## 2021-05-01 ENCOUNTER — Other Ambulatory Visit: Payer: Self-pay

## 2021-05-01 MED ORDER — INFLUENZA VAC SPLIT QUAD 0.5 ML IM SUSY
PREFILLED_SYRINGE | INTRAMUSCULAR | 0 refills | Status: DC
  Filled 2021-05-01: qty 0.5, 1d supply, fill #0

## 2021-05-29 ENCOUNTER — Other Ambulatory Visit: Payer: Self-pay

## 2021-06-05 ENCOUNTER — Other Ambulatory Visit: Payer: Self-pay

## 2021-06-12 ENCOUNTER — Other Ambulatory Visit: Payer: Self-pay

## 2021-06-20 ENCOUNTER — Other Ambulatory Visit: Payer: Self-pay

## 2021-09-17 ENCOUNTER — Other Ambulatory Visit: Payer: Self-pay

## 2021-10-08 ENCOUNTER — Ambulatory Visit: Payer: Self-pay | Admitting: *Deleted

## 2021-10-08 NOTE — Telephone Encounter (Signed)
Summary: BH concern   The patient has recently had some occupational setbacks   The patient's aunt is concerned if the patient's recent change in employment has had an effect on their behavior   The patient is resistant towards completing daily tasks and defiant with requests from family   Please contact further when available       Called patient's aunt on [place] [phone] to review behavioral concerns . No answer, mailbox full. Unable to leave message to call back.

## 2021-10-08 NOTE — Telephone Encounter (Signed)
Second attempt to reach Brent [name]- no answer and VM not set up. Unable to leave call back message

## 2021-10-08 NOTE — Telephone Encounter (Signed)
3rd attempt to contact patient's aunt. No answer, voicemail box has not been set up. Unable to leave message to call back. Please advise

## 2021-10-09 NOTE — Telephone Encounter (Signed)
  Chief Complaint: behavior changes Symptoms: loss of job, lack of self care, medication management Frequency: ongoing Pertinent Negatives:   Disposition: [] ED /[x] Urgent Care (no appt availability in office) / [] Appointment(In office/virtual)/ []  [place] [facility]/ [] Home Care/ [] Refused Recommended Disposition /[] [place] [facility]/ []  Follow-up with PCP Additional Notes: Patient's aunt is concerned about patient- she states he has had a lot of changes- advised [place]- future appointment with PCP has been scheduled. Requesting referral for council ing

## 2021-10-09 NOTE — Telephone Encounter (Signed)
Reason for Disposition . Requesting regular office appointment  Answer Assessment - Initial Assessment Questions 1. REASON FOR CALL or QUESTION: "What is your reason for calling today?" or "How can I best help you?" or "What question do you have that I can help answer?"     Patient has had behavior changes- lost job, loss of interest in normal activity- self care, truthfulness, feels medication not helping him  Protocols used: Information Only Call - No Triage-A-AH

## 2021-10-09 NOTE — Telephone Encounter (Signed)
Call placed to patient and VM is not currently set up.

## 2021-10-10 NOTE — Telephone Encounter (Signed)
Yes pt should go to [facility] on [address]

## 2021-10-14 NOTE — Telephone Encounter (Signed)
Attempt to call patient x 2.  No answer, unable to leave voicemail.

## 2021-10-15 NOTE — Telephone Encounter (Addendum)
Attempt to call patient. LVM to return call.

## 2021-10-27 ENCOUNTER — Ambulatory Visit: Payer: Self-pay | Admitting: *Deleted

## 2021-10-27 NOTE — Telephone Encounter (Signed)
Brent [name] calling in.   I called in last month.  I kept missing the calls.  I'm calling about him   He takes Sertrline by Dr. Delford Field.   I wonder if it is working.   They told me to take him to office for behavioral health.   There was too many people there so I didn't wait.    He is not violent.   He has these mad outbursts.   He lost his job.  He blames it on everyone else.     I don't know what the medicine is supposed to do.     Aunt denies he is suicidal.   He is lazy and don't want to do anything.  He wears dirty clothes.  His father is in a nursing home and don't know where his mother is.    My parents took him in to take care of him all through his school.   Reason for Disposition  Depression keeps from going to school or other normal activities  Answer Assessment - Initial Assessment Questions 1. CONCERN: "What happened that made you call today?" "What is your main question or concern?"     [name] on [employer] called in.   The pt is having mad outbursts in his behavior after loosing his job.   He is taking sertraline that Dr. Delford Field prescribed for him but I don't know if it's working or not.    2. RISK OF HARM - SUICIDAL ATTEMPT or THOUGHTS "Have you ever tried to hurt yourself?" If yes, "When was that?"  "Do you ever have thoughts of hurting yourself?"      She doesn't see or hear of him wanting to commit suicide.    3. ONSET: "When did the sadness or depression begin?"     When he lost his job.    [facility] on [address] but there was so many people there I didn't want to wait so we left.     4. EVENTS AND STRESSORS: "Has there been any recent changes, new stressors, pressures, or upsetting events in your life?" (e.g., recent loss of loved one, etc)     Lost his job and blaming it on everyone else. 5. FUNCTIONAL IMPAIRMENT: "How have things been going at home and at school (or work)? (same, better, or worse). "Are your sad  feelings keeping you from doing any of your normal daily activities?" (such as school, work, friendships, teams, clubs)     He is lazy and wearing his dirty clothes out of the clothes hamper.   He gets mad and has thee outbursts.   He hasn't tried to hurt me or anything. 6. RECURRENT SYMPTOMS: "Have you ever been this sad or depressed before?" If so, ask: "When was the last time?" and "What happened that time?"     Yes he has been having these problems most of his life. 7. THERAPIST: "Do you have a counselor or therapist? Name?"     No   Sees Dr. Shan Levans 8. PARENT QUESTION - TEEN'S APPEARANCE: "How does your teen look?" "What are they doing right now?"     He is depressed. She is not with him during the call.   Note to [place]: It's better to speak to the older child or teen directly for these calls.  Protocols used: Depression-P-AH

## 2021-10-27 NOTE — Telephone Encounter (Signed)
Yes he needs to go to [facility] first floor at [address]

## 2021-10-27 NOTE — Telephone Encounter (Signed)
Noted, pcp was sent a message about patient

## 2021-10-27 NOTE — Telephone Encounter (Signed)
Pt's aunt called to report that the patient is increasingly more difficult to deal with. He is "stubborn and only wants to do things his way"  Best contact: [phone]  Please advise, do not contact pt directly please.    Attempted to reach pts aunt, on [place], at number provided. VM not set up, unable to leave message to call back.

## 2021-10-27 NOTE — Telephone Encounter (Signed)
Patient's aunt states she has just spoken with a nurse and she is going to try to take [place] to the [facility].  They will follow up in the office. Answer Assessment - Initial Assessment Questions 1. REASON FOR CALL or QUESTION: "What is your reason for calling today?" or "How can I best help you?" or "What question do you have that I can help answer?"     Patient's aunt states she has already spoken to a nurse.  Protocols used: Information Only Call - No Triage-A-AH

## 2021-10-27 NOTE — Telephone Encounter (Signed)
Called and spoke with [name](on dpr) she said that she will take back to behavior health when they first open up  She did question the medication, not knowing if its working or not  He does have a appt the first of august, do you want to see him sooner?  I also told her to give [place] a call if anything changes!

## 2021-10-27 NOTE — Telephone Encounter (Signed)
Noted she will stated early she will take him when they open at 7 am

## 2021-10-27 NOTE — Telephone Encounter (Signed)
  Chief Complaint: Having mad outbursts in his behavior because he lost his job Symptoms: Depression.   He is on sertraline  Frequency: Every so often since losing his job Pertinent Negatives: Patient denies him being suicidal. Disposition: [x] ED /[] Urgent Care (no appt availability in office) / [] Appointment(In office/virtual)/ []  [place] [facility]/ [] Home Care/ [] Refused Recommended Disposition /[] [place] [facility]/ []  Follow-up with PCP Additional Notes: She is going to take him to [facility].

## 2021-12-16 ENCOUNTER — Ambulatory Visit: Payer: Self-pay | Admitting: Critical Care Medicine

## 2021-12-16 ENCOUNTER — Other Ambulatory Visit: Payer: Self-pay

## 2021-12-19 ENCOUNTER — Other Ambulatory Visit: Payer: Self-pay

## 2022-02-12 ENCOUNTER — Ambulatory Visit: Payer: Self-pay | Attending: Critical Care Medicine | Admitting: Critical Care Medicine

## 2022-02-12 ENCOUNTER — Encounter: Payer: Self-pay | Admitting: Critical Care Medicine

## 2022-02-12 ENCOUNTER — Other Ambulatory Visit: Payer: Self-pay

## 2022-02-12 ENCOUNTER — Telehealth: Payer: Self-pay | Admitting: Critical Care Medicine

## 2022-02-12 VITALS — BP 118/86 | HR 82 | Temp 98.3°F | Ht 72.0 in | Wt 135.4 lb

## 2022-02-12 DIAGNOSIS — F32A Depression, unspecified: Secondary | ICD-10-CM

## 2022-02-12 DIAGNOSIS — F419 Anxiety disorder, unspecified: Secondary | ICD-10-CM

## 2022-02-12 DIAGNOSIS — Z8719 Personal history of other diseases of the digestive system: Secondary | ICD-10-CM

## 2022-02-12 DIAGNOSIS — F09 Unspecified mental disorder due to known physiological condition: Secondary | ICD-10-CM

## 2022-02-12 DIAGNOSIS — Z23 Encounter for immunization: Secondary | ICD-10-CM

## 2022-02-12 MED ORDER — SERTRALINE HCL 100 MG PO TABS
100.0000 mg | ORAL_TABLET | Freq: Every day | ORAL | 1 refills | Status: DC
  Filled 2022-02-12: qty 90, 90d supply, fill #0

## 2022-02-12 NOTE — Assessment & Plan Note (Signed)
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

## 2022-02-12 NOTE — Progress Notes (Signed)
Established Patient Office Visit  Subjective   Patient ID: Brent [name], male    DOB: [birth year]  Age: 46 y.o. MRN: [id number]  Chief Complaint  Patient presents with   Follow-up    Med f/u.  No questions / concerns.    01/30/21 HPI GERALDINE TESAR presents for primary care to establish.  This is a former Dr. Jillyn Hidden patient last seen in September 2021.  Patient is accompanied by his aunt Ms. [name].  Patient grew up with his mother however his aunt has been helping to take care of him the past 10 years.  This is a 46 year old male who has a history of progressive cognitive dysfunction.  He has have difficulty holding down jobs.  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  He does tend to talk to himself a lot but it is uncertain that he actually has true hallucinations.  His aunt was so concerned she took him to urgent care on 1 September.  They noted a normal blood sugar normal urinalysis and wanted him to reestablish for primary care [place]'s visit is today.  Note he is uninsured.  Patient states he has no real symptoms.  He denies any dysuria.  His review of system is negative.   Patient has no real chronic medical problems at this time.  He does have a history of some anxiety and does relate to some change of his mentation and that he has having increased anxiety and some dysphoria.  He does not have a mental health provider at this time. The patient went to urgent care complaining some visual changes he does use glasses   Cognitive and neurobehavioral dysfunction - Primary       This patient is given a longstanding history of progressive cognitive and behavioral dysfunction.  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  His job performance is not optimal  even at his level.  He finished high school but appears to be forming at a lower level.  No pre-existing mental health conditions or mental limitation conditions.   I would he would ideally benefit from a neuropsych eval but given his lack of insurance we will try to achieve the orange card [place] discount ASAP and in the meantime refer him to neurology for further evaluation also we will have him go to the [facility] behavioral health center for mental health evaluation as well.   For his mild depression and anxiety I will prescribe sertraline 50 mg daily.   Complete set of health screening labs were sent as well today.   The aunt wanted me to sign a letter stating he could not participate in jury duty however I cannot do that because his functionality is high enough that it would be very unlikely he would be excused from these duties     02/12/22 This patient is seen in return follow-up he has not been seen since September 2022.  His aunt brings him in today for follow-up.  He has a history of intellectual limitations and has been ruled out to have any form of dementia or other primary organic brain syndrome.  He also has anxiety and depression and anger management challenges.  He lives with his aunt who is a retired  educator professional.  He now has been working with vocational rehab has a job at [employer] works 2 days a week trying to increase that time.  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  He has not expressed anger in public but with his aunt he becomes angry but there is no physical abuse.  He has been on sertraline 50 mg daily he states has helped to some degree.  He never connected with our licensed social work nor was he able to achieve an appointment behavioral health [place].  There are no other complaints.  He does have dental problems and is goes to [facility] his dental care.  He has multiple cavities that need to be  addressed and some molar teeth need to be removed.  He had acute pharyngitis previously this year treated and has responded and now resolved.  On arrival blood pressure is 118/86.      Review of Systems  Constitutional:  Negative for chills, diaphoresis, fever, malaise/fatigue and weight loss.  HENT:  Negative for congestion, hearing loss, nosebleeds, sore throat and tinnitus.        Dental problem  Eyes:  Negative for blurred vision, photophobia and redness.  Respiratory:  Negative for cough, hemoptysis, sputum production, shortness of breath, wheezing and stridor.   Cardiovascular:  Negative for chest pain, palpitations, orthopnea, claudication, leg swelling and PND.  Gastrointestinal:  Negative for abdominal pain, blood in stool, constipation, diarrhea, heartburn, nausea and vomiting.  Genitourinary:  Negative for dysuria, flank pain, frequency, hematuria and urgency.  Musculoskeletal:  Negative for back pain, falls, joint pain, myalgias and neck pain.  Skin:  Negative for itching and rash.  Neurological:  Negative for dizziness, tingling, tremors, sensory change, speech change, focal weakness, seizures, loss of consciousness, weakness and headaches.  Endo/Heme/Allergies:  Negative for environmental allergies and polydipsia. Does not bruise/bleed easily.  Psychiatric/Behavioral:  Negative for depression, memory loss, substance abuse and suicidal ideas. The patient is nervous/anxious. The patient does not have insomnia.        Easily angered      Objective:     BP 118/86 (BP Location: Left Arm, Patient Position: Sitting, Cuff Size: Normal)   Pulse 82   Temp 98.3 F (36.8 C)   Ht 6' (1.829 m)   Wt 135 lb 6.4 oz (61.4 kg)   SpO2 100%   BMI 18.36 kg/m    Physical Exam Vitals reviewed.  Constitutional:      Appearance: Normal appearance. He is well-developed. He is not diaphoretic.  HENT:     Head: Normocephalic and atraumatic.     Nose: No nasal deformity, septal  deviation, mucosal edema or rhinorrhea.     Right Sinus: No maxillary sinus tenderness or frontal sinus tenderness.     Left Sinus: No maxillary sinus tenderness or frontal sinus tenderness.     Mouth/Throat:     Mouth: Mucous membranes are moist.     Pharynx: Oropharynx is clear. No oropharyngeal exudate.     Comments: Poor dentition Eyes:     General: No scleral icterus.    Conjunctiva/sclera: Conjunctivae normal.     Pupils: Pupils are equal, round, and reactive to light.  Neck:     Thyroid: No thyromegaly.     Vascular: No carotid bruit or JVD.     Trachea: Trachea normal. No tracheal tenderness or tracheal deviation.  Cardiovascular:     Rate and Rhythm: Normal rate and  regular rhythm.     Chest Wall: PMI is not displaced.     Pulses: Normal pulses. No decreased pulses.     Heart sounds: Normal heart sounds, S1 normal and S2 normal. Heart sounds not distant. No murmur heard.    No systolic murmur is present.     No diastolic murmur is present.     No friction rub. No gallop. No S3 or S4 sounds.  Pulmonary:     Effort: No tachypnea, accessory muscle usage or respiratory distress.     Breath sounds: No stridor. No decreased breath sounds, wheezing, rhonchi or rales.  Chest:     Chest wall: No tenderness.  Abdominal:     General: Bowel sounds are normal. There is no distension.     Palpations: Abdomen is soft. Abdomen is not rigid.     Tenderness: There is no abdominal tenderness. There is no guarding or rebound.  Musculoskeletal:        General: Normal range of motion.     Cervical back: Normal range of motion and neck supple. No edema, erythema or rigidity. No muscular tenderness. Normal range of motion.  Lymphadenopathy:     Head:     Right side of head: No submental or submandibular adenopathy.     Left side of head: No submental or submandibular adenopathy.     Cervical: No cervical adenopathy.  Skin:    General: Skin is warm and dry.     Coloration: Skin is not pale.      Findings: No rash.     Nails: There is no clubbing.  Neurological:     Mental Status: He is alert and oriented to person, place, and time.     Sensory: No sensory deficit.  Psychiatric:        Speech: Speech normal.        Behavior: Behavior normal.      No results found for any visits on 02/12/22.    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  Anxiety depression contributing  Anger management issues  Vitamin levels normal  Plan is to continue with vocational rehab increase sertraline to 100 mg daily get him in with behavioral health and our licensed clinical social work      Anxiety and depression    We will increase dose of sertraline to 100 mg daily      Relevant Medications   sertraline (ZOLOFT) 100 MG tablet   History of dental problems    Has follow-up at [place] [facility]      Other Visit Diagnoses     Need for immunization against influenza    -  Primary   Relevant Orders   Flu Vaccine QUAD 35mo+IM (Fluarix, Fluzone & Alfiuria Quad PF) (Completed)     Flu vaccine given  Return in about 5 months (around 07/15/2022) for chronic conditions.    Asencion Noble, MD

## 2022-02-12 NOTE — Assessment & Plan Note (Signed)
Has follow-up at [place] [facility]

## 2022-02-12 NOTE — Assessment & Plan Note (Signed)
We will increase dose of sertraline to 100 mg daily

## 2022-02-12 NOTE — Telephone Encounter (Signed)
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

## 2022-02-12 NOTE — Patient Instructions (Addendum)
Flu vaccine was given  Increase sertraline to 100 mg daily new prescription sent to pharmacy downstairs you can take 2 of the 50 mg daily but the new prescription will be a single 100 mg tablet to take daily this is for anxiety and mood swings  No other medications for now  We will have our licensed clinical social worker call for counseling  Return to Dr. Joya Gaskins 5 months  Follow-up at [facility] for your dental care   Go to the [facility] [address]. as below go during the new patient medication management walk-in visits which are listed below [place] patient psychiatry and medication management walk-ins: Mondays, Wednesdays, Thursdays, Fridays 8am-11am No psychiatry walk-ins on Tuesday

## 2022-03-11 NOTE — Telephone Encounter (Signed)
Contacted patient to discuss support regarding he depression and anger management. Patient did not answer and I was not able to leave a message because vmail is not set up

## 2022-04-08 ENCOUNTER — Ambulatory Visit: Payer: Self-pay | Admitting: *Deleted

## 2022-04-08 NOTE — Telephone Encounter (Signed)
Summary: weight loss   Pt is having issues with weight loss/ pts aunt asked for an appt for today /please advise       Chief Complaint: patient's aunt on DPR reports patient noted with weight loss and signs of depression.  Symptoms: wieght loss. Clothes noted hanging on him and looks frail. Noted not to be very verbal and when he is he cusses her. Not motivated for self care will not wash own clothes wears dirty clothes. No interest in anything. Does work got a job recently but aunt has to stay on him to work.  Frequency: approx. 1 month  Pertinent Negatives: Patient  aunt denies patient with self harm.  Disposition: [x] ED /[] Urgent Care (no appt availability in office) / [] Appointment(In office/virtual)/ []  [place] [facility]/ [] Home Care/ [] Refused Recommended Disposition /[] [place] [facility]/ [x]  Follow-up with PCP Additional Notes:   Encouraged patient aunt to schedule appt and aunt reports she wants to take patient to [facility]. Instructed patient if PCP recommended to take patient then she should take as instructed. To talk about his feelings.  Please advise         Reason for Disposition  MODERATE unexplained weight loss (e.g., 5%, 8 to 10 pounds [4 - 5 kg] in person who weighs 170 to 200 pounds [75 - 90 kg])  [1] Significant weight loss (i.e., > 10 pounds or 5 kg) AND [2] not dieting  Answer Assessment - Initial Assessment Questions 1. MAIN CONCERN: "What is your main concern today?"     Weight loss 2. WEIGHT LOSS: "How much weight have you lost?"  (e.g., lbs., kgs.)  "Over what period of time have you lost this weight?"  (e.g., number of days, weeks, months, years)     Looks frail, clothes hanging on him.  3. BASELINE WEIGHT: "What is your baseline or normal weight?" (e.g., "How much do you usually weigh?")     unknown 4. CAUSE: "What do you think is causing the weight loss?" (e.g., depression, anxiety, medicine side effect, pain, trouble swallowing, substance or alcohol  use problem, eating disorder)     Depression , not interested in any doing any tasks  5. PRIOR EVALUATION: "Have you been evaluated by a doctor for your weight loss?" If Yes, ask "When was your last visit?" "What did your doctor (or NP/PA) tell you about the possible cause?"     na 6. HEART FAILURE TREATMENT: "Do you have heart failure?" If Yes, ask: "Have you taken new or extra water pills (diuretics) recently?" (e.g., furosemide; bumetanide). "What is your target weight?"     na 7. OTHER SYMPTOMS: "Do you have any other symptoms?" (e.g., anxiety or depression, blood in stool, breathing difficulty, diarrhea, fever, trouble swallowing)     Signs of depression noted , unmotivated and not interested in doing any tasks. Weight loss. 8. PREGNANCY: "Is there any chance you are pregnant?" "When was your last menstrual period?"     na  Answer Assessment - Initial Assessment Questions 1. CONCERN: "What happened that made you call today?"     Not interested in doing any tasks, does work  per aunt on DPR  2. DEPRESSION SYMPTOM SCREENING: "How are you feeling overall?" (e.g., decreased energy, increased sleeping or difficulty sleeping, difficulty concentrating, feelings of sadness, guilt, hopelessness, or worthlessness)     Reports patient has not interest in talking , noted weight loss. Watching porn on TV , cusses at her and wants "things" right now. 3. RISK OF HARM -  SUICIDAL IDEATION:  "Do you ever have thoughts of hurting or killing yourself?"  (e.g., yes, no, no but preoccupation with thoughts about death)   - INTENT:  "Do you have thoughts of hurting or killing yourself right NOW?" (e.g., yes, no, N/A)   - PLAN: "Do you have a specific plan for how you would do this?" (e.g., gun, knife, overdose, no plan, N/A)     unknown 4. RISK OF HARM - HOMICIDAL IDEATION:  "Do you ever have thoughts of hurting or killing someone else?"  (e.g., yes, no, no but preoccupation with thoughts about death)   - INTENT:   "Do you have thoughts of hurting or killing someone right NOW?" (e.g., yes, no, N/A)   - PLAN: "Do you have a specific plan for how you would do this?" (e.g., gun, knife, no plan, N/A)      na 5. FUNCTIONAL IMPAIRMENT: "How have things been going for you overall? Have you had more difficulty than usual doing your normal daily activities?"  (e.g., better, same, worse; self-care, school, work, interactions)     No interest in helping self on allow others to do for him does work at a job recently. Will not help wash clothing or take care of his needs. Self care  6. SUPPORT: "Who is with you now?" "Who do you live with?" "Do you have family or friends who you can talk to?"      Caller reports patient lives with her but she is not able to provide support much longer. 7. THERAPIST: "Do you have a counselor or therapist? Name?"     na 8. STRESSORS: "Has there been any new stress or recent changes in your life?"     Not sure  9. ALCOHOL USE OR SUBSTANCE USE (DRUG USE): "Do you drink alcohol or use any illegal drugs?"     na 10. OTHER: "Do you have any other physical symptoms right now?" (e.g., fever)       Weight loss, no interest in completing tasks. Irritable with aunt. 11. PREGNANCY: "Is there any chance you are pregnant?" "When was your last menstrual period?"       na  Protocols used: Weight Loss - Unintended-A-AH, Depression-A-AH

## 2022-04-13 NOTE — Telephone Encounter (Signed)
[facility] has been repeatedly advised  I am ok if you move him up from Feb appt to December

## 2022-04-15 ENCOUNTER — Ambulatory Visit: Payer: Self-pay | Admitting: *Deleted

## 2022-04-15 NOTE — Telephone Encounter (Signed)
Summary: severe cough   Pt has a severe cough and wants to be seen asap  / pt doesn't speak well / his care taker also stated he has been losing weight / please advise      Attempted to call Brent [name]( DPR) no answer and no VM set up. Attempted to call patient- left message on cell number to call office.

## 2022-04-15 NOTE — Telephone Encounter (Signed)
Patient schedule for an appt. 04/23/2022 at 1050.  Aunt omn DPR and made aware of appt.

## 2022-04-15 NOTE — Telephone Encounter (Signed)
  Chief Complaint: dry cough Symptoms: no other sx Frequency: Monday Pertinent Negatives: Patient denies nasal congestion, runny nose, fever, wheezing, SOB Disposition: [] ED /[] Urgent Care (no appt availability in office) / [] Appointment(In office/virtual)/ []  [place] [facility]/ [x] Home Care/ [] Refused Recommended Disposition /[] [place] [facility]/ []  Follow-up with PCP Additional Notes: advised to give pt OTC cough medication Reason for Disposition  Cough with cold symptoms (e.g., runny nose, postnasal drip, throat clearing)  Answer Assessment - Initial Assessment Questions 1. ONSET: "When did the cough begin?"      Monday 2. SEVERITY: "How bad is the cough today?"      Severe cough 3. SPUTUM: "Describe the color of your sputum" (none, dry cough; clear, white, yellow, green)     Does not know 4. HEMOPTYSIS: "Are you coughing up any blood?" If so ask: "How much?" (flecks, streaks, tablespoons, etc.)     unsure 5. DIFFICULTY BREATHING: "Are you having difficulty breathing?" If Yes, ask: "How bad is it?" (e.g., mild, moderate, severe)    - MILD: No SOB at rest, mild SOB with walking, speaks normally in sentences, can lie down, no retractions, pulse < 100.    - MODERATE: SOB at rest, SOB with minimal exertion and prefers to sit, cannot lie down flat, speaks in phrases, mild retractions, audible wheezing, pulse 100-120.    - SEVERE: Very SOB at rest, speaks in single words, struggling to breathe, sitting hunched forward, retractions, pulse > 120      No SOB 6. FEVER: "Do you have a fever?" If Yes, ask: "What is your temperature, how was it measured, and when did it start?"     no 7. CARDIAC HISTORY: "Do you have any history of heart disease?" (e.g., heart attack, congestive heart failure)      no 8. LUNG HISTORY: "Do you have any history of lung disease?"  (e.g., pulmonary embolus, asthma, emphysema)     no 9. PE RISK FACTORS: "Do you have a history of blood clots?" (or: recent  major surgery, recent prolonged travel, bedridden)     no 10. OTHER SYMPTOMS: "Do you have any other symptoms?" (e.g., runny nose, wheezing, chest pain)       Runny nose 11. PREGNANCY: "Is there any chance you are pregnant?" "When was your last menstrual period?"       N/a 12. TRAVEL: "Have you traveled out of the country in the last month?" (e.g., travel history, exposures)       N/a  Protocols used: Cough - Acute Productive-A-AH

## 2022-04-19 ENCOUNTER — Encounter (HOSPITAL_COMMUNITY): Payer: Self-pay | Admitting: *Deleted

## 2022-04-19 ENCOUNTER — Ambulatory Visit (HOSPITAL_COMMUNITY)
Admission: EM | Admit: 2022-04-19 | Discharge: 2022-04-19 | Disposition: A | Discharge status: Home / Self Care | Payer: Self-pay | Attending: Emergency Medicine | Admitting: Emergency Medicine

## 2022-04-19 DIAGNOSIS — H6122 Impacted cerumen, left ear: Secondary | ICD-10-CM

## 2022-04-19 DIAGNOSIS — J069 Acute upper respiratory infection, unspecified: Secondary | ICD-10-CM

## 2022-04-19 MED ORDER — GUAIFENESIN ER 600 MG PO TB12
600.0000 mg | ORAL_TABLET | Freq: Two times a day (BID) | ORAL | 0 refills | Status: AC
Start: 2022-04-19 — End: 2022-04-24

## 2022-04-19 MED ORDER — CETIRIZINE HCL 10 MG PO TABS: 10.0000 mg | ORAL_TABLET | Freq: Every day | ORAL | 2 refills | Status: DC

## 2022-04-19 NOTE — ED Triage Notes (Signed)
Pt reports he has felt sick for 6-7 days . Aunt in room with Pt reported Pt was talking out of his head 6 days ago and had a fever of 99. Pt reports he has nausea and can not eat. Pt also has a HA off and on.

## 2022-04-19 NOTE — ED Provider Notes (Signed)
[facility]    CSN: [id number] Arrival date & time: 04/19/22  1003     History   Chief Complaint Chief Complaint  Patient presents with   Cough   not eating   Fatigue   Fever    HPI Brent [name] is a 46 y.o. male.  Accompanied by aunt. Patient with history of cognitive dysfunction Presents with 6 day history of cough, congestion, sneezing No fevers Decreased appetite, nausea on and off. No vomiting or diarrhea. 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 Authorizing Provider  cetirizine (ZYRTEC ALLERGY) 10 MG tablet Take 1 tablet (10 mg total) by mouth daily. 04/19/22  Yes Edmond Ginsberg, Lurena Joiner, PA-C  guaiFENesin (MUCINEX) 600 MG 12 hr tablet Take 1 tablet (600 mg total) by mouth every 12 (twelve) hours for 5 days. 04/19/22 04/24/22 Yes Chelsee Hosie, Lurena Joiner, PA-C  Multiple Vitamin (MULTIVITAMIN ADULT) TABS Take 1 tablet by mouth daily.    [provider]  sertraline (ZOLOFT) 100 MG tablet Take 1 tablet (100 mg total) by mouth daily. 02/12/22   Storm Frisk, MD    Family History Family History  Problem Relation Age of Onset   Diabetes Father    Hypertension Father    Ovarian cancer Paternal Grandmother    Prostate cancer Paternal Grandfather     Social History Social History   Tobacco Use   Smoking status: Never   Smokeless tobacco: Never  Vaping Use   Vaping Use: Never used  Substance Use Topics   Alcohol use: Never    Drug use: No     Allergies   Patient has no known allergies.   Review of Systems Review of Systems  As per HPI  Physical Exam Triage Vital Signs ED Triage Vitals  Enc Vitals Group     BP      Pulse      Resp      Temp      Temp src      SpO2      Weight      Height      Head Circumference      Peak Flow      Pain Score      Pain Loc      Pain Edu?      Excl. in GC?    No data found.  Updated Vital Signs BP 97/66   Pulse 76   Temp 97.8 F (36.6 C)   Resp 20   SpO2 98%    Physical Exam Vitals and nursing note reviewed.  Constitutional:      General: He is not in acute distress. HENT:     Right Ear: Tympanic membrane and ear canal normal.     Ears:     Comments: Left ear with moderate wax    Nose: Congestion present. No rhinorrhea.     Mouth/Throat:  Mouth: Mucous membranes are moist.     Pharynx: Uvula midline. No posterior oropharyngeal erythema.     Tonsils: No tonsillar exudate or tonsillar abscesses.  Eyes:     Conjunctiva/sclera: Conjunctivae normal.     Pupils: Pupils are equal, round, and reactive to light.  Cardiovascular:     Rate and Rhythm: Normal rate and regular rhythm.     Heart sounds: Normal heart sounds.  Pulmonary:     Effort: Pulmonary effort is normal. No respiratory distress.     Breath sounds: Normal breath sounds. No wheezing.  Abdominal:     Tenderness: There is no abdominal tenderness.  Musculoskeletal:     Cervical back: Normal range of motion.  Lymphadenopathy:     Cervical: No cervical adenopathy.  Neurological:     Mental Status: He is alert and oriented to person, place, and time.     UC Treatments / Results  Labs (all labs ordered are listed, but only abnormal results are displayed) Labs Reviewed - No data to display  EKG  Radiology No results found.  Procedures Procedures (including critical care time)  Medications Ordered in UC Medications - No data to display  Initial Impression / Assessment  and Plan / UC Course  I have reviewed the triage vital signs and the nursing notes.  Pertinent labs & imaging results that were available during my care of the patient were reviewed by me and considered in my medical decision making (see chart for details).  Afebrile here Well appearing and lungs are clear Discussed with patient and aunt that viral testing wouldn't change treatment plan at this time given day 6 of symptoms Recommend daily allergy med for congestion and sneezing, add mucinex BID for congestion and cough. Discussed increasing fluids and building back up to normal appetite. He does have PCP visit coming up this week. For ear wax discussed debrox drops, can return as needed for irrigation. Return precautions discussed. Patient agrees to plan  Final Clinical Impressions(s) / UC Diagnoses   Final diagnoses:  Viral URI with cough  Excessive ear wax, left     Discharge Instructions      I recommend the daily zyrtec (allergy medicine) with the mucinex for cough and congestion. Give these a try for the next 4-5 days, and make sure to drink lots of water.   Please return if symptoms persist despite medication.  For the ear wax, try the DEBROX drops in the left ear, twice daily, for the next 5 days. This should help dissolve the wax.      ED Prescriptions     Medication Sig Dispense Auth. Provider   guaiFENesin (MUCINEX) 600 MG 12 hr tablet Take 1 tablet (600 mg total) by mouth every 12 (twelve) hours for 5 days. 10 tablet Ciena Sampley, PA-C   cetirizine (ZYRTEC ALLERGY) 10 MG tablet Take 1 tablet (10 mg total) by mouth daily. 30 tablet Satara Virella, Lurena Joiner, PA-C      PDMP not reviewed this encounter.   Marlow Baars, Cordelia Poche 04/19/22 1118

## 2022-04-19 NOTE — Discharge Instructions (Addendum)
I recommend the daily zyrtec (allergy medicine) with the mucinex for cough and congestion. Give these a try for the next 4-5 days, and make sure to drink lots of water.   Please return if symptoms persist despite medication.  For the ear wax, try the DEBROX drops in the left ear, twice daily, for the next 5 days. This should help dissolve the wax.

## 2022-04-23 ENCOUNTER — Ambulatory Visit: Payer: Self-pay | Admitting: Critical Care Medicine
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
## 2022-07-15 ENCOUNTER — Ambulatory Visit: Payer: Self-pay | Attending: Critical Care Medicine | Admitting: Critical Care Medicine

## 2022-07-15 ENCOUNTER — Encounter: Payer: Self-pay | Admitting: Critical Care Medicine

## 2022-07-15 VITALS — BP 111/75 | HR 86 | Temp 98.6°F | Ht 72.0 in | Wt 143.0 lb

## 2022-07-15 DIAGNOSIS — F32A Depression, unspecified: Secondary | ICD-10-CM

## 2022-07-15 DIAGNOSIS — F419 Anxiety disorder, unspecified: Secondary | ICD-10-CM

## 2022-07-15 DIAGNOSIS — E10A1 Type 1 diabetes mellitus, presymptomatic, stage 1: Secondary | ICD-10-CM | POA: Insufficient documentation

## 2022-07-15 DIAGNOSIS — Z139 Encounter for screening, unspecified: Secondary | ICD-10-CM

## 2022-07-15 DIAGNOSIS — F09 Unspecified mental disorder due to known physiological condition: Secondary | ICD-10-CM

## 2022-07-15 DIAGNOSIS — Z1211 Encounter for screening for malignant neoplasm of colon: Secondary | ICD-10-CM

## 2022-07-15 DIAGNOSIS — R739 Hyperglycemia, unspecified: Secondary | ICD-10-CM | POA: Insufficient documentation

## 2022-07-15 DIAGNOSIS — R7303 Prediabetes: Secondary | ICD-10-CM | POA: Insufficient documentation

## 2022-07-15 MED ORDER — SERTRALINE HCL 100 MG PO TABS: 100.0000 mg | ORAL_TABLET | Freq: Every day | ORAL | 1 refills | Status: DC

## 2022-07-15 NOTE — Patient Instructions (Addendum)
[facility] [address] [place], [place] [address] [phone] or [phone] Walk-in urgent care 24/7 for anyone  For [facility] ONLY New patient psychiatry and medication management walk-ins: Mondays, Wednesdays, Thursdays, Fridays 8am-11am No psychiatry walk-ins on Tuesday    Stay on sertraline until seen by psychiatry  Labs to be obtained today

## 2022-07-15 NOTE — Assessment & Plan Note (Signed)
As per cognitive behavioral assessment

## 2022-07-15 NOTE — Assessment & Plan Note (Signed)
Patient with hyperglycemia in the past we will reassess metabolic panel and [id number]
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
## 2022-07-15 NOTE — Assessment & Plan Note (Signed)
The patient needs follow-up with mental health given the patient and his aunt times he can go for walk-in care [facility] mental health he is uninsured I will continue sertraline for now

## 2022-07-16 ENCOUNTER — Encounter: Payer: Self-pay | Admitting: Critical Care Medicine

## 2022-07-16 ENCOUNTER — Other Ambulatory Visit: Payer: Self-pay | Admitting: Critical Care Medicine

## 2022-07-16 DIAGNOSIS — E785 Hyperlipidemia, unspecified: Secondary | ICD-10-CM | POA: Insufficient documentation

## 2022-07-16 HISTORY — DX: Hyperlipidemia, unspecified: E78.5

## 2022-07-16 LAB — CBC WITH DIFFERENTIAL/PLATELET
Basophils Absolute: 0 10*3/uL (ref 0.0–0.2)
Basos: 1 %
EOS (ABSOLUTE): 0.1 10*3/uL (ref 0.0–0.4)
Eos: 2 %
Hematocrit: 43.8 % (ref 37.5–51.0)
Hemoglobin: 14.6 g/dL (ref 13.0–17.7)
Immature Grans (Abs): 0 10*3/uL (ref 0.0–0.1)
Immature Granulocytes: 0 %
Lymphocytes Absolute: 1 10*3/uL (ref 0.7–3.1)
Lymphs: 20 %
MCH: 29.1 pg (ref 26.6–33.0)
MCHC: 33.3 g/dL (ref 31.5–35.7)
MCV: 87 fL (ref 79–97)
Monocytes Absolute: 0.5 10*3/uL (ref 0.1–0.9)
Monocytes: 9 %
Neutrophils Absolute: 3.5 10*3/uL (ref 1.4–7.0)
Neutrophils: 68 %
Platelets: 223 10*3/uL (ref 150–450)
RBC: 5.02 x10E6/uL (ref 4.14–5.80)
RDW: 14.2 % (ref 11.6–15.4)
WBC: 5.1 10*3/uL (ref 3.4–10.8)

## 2022-07-16 LAB — COMPREHENSIVE METABOLIC PANEL
ALT: 15 IU/L (ref 0–44)
AST: 17 IU/L (ref 0–40)
Albumin/Globulin Ratio: 1.7 (ref 1.2–2.2)
Albumin: 4.7 g/dL (ref 4.1–5.1)
Alkaline Phosphatase: 94 IU/L (ref 44–121)
BUN/Creatinine Ratio: 21 — ABNORMAL HIGH (ref 9–20)
BUN: 21 mg/dL (ref 6–24)
Bilirubin Total: 0.4 mg/dL (ref 0.0–1.2)
CO2: 25 mmol/L (ref 20–29)
Calcium: 9.8 mg/dL (ref 8.7–10.2)
Chloride: 102 mmol/L (ref 96–106)
Creatinine, Ser: 1 mg/dL (ref 0.76–1.27)
Globulin, Total: 2.7 g/dL (ref 1.5–4.5)
Glucose: 75 mg/dL (ref 70–99)
Potassium: 4.5 mmol/L (ref 3.5–5.2)
Sodium: 142 mmol/L (ref 134–144)
Total Protein: 7.4 g/dL (ref 6.0–8.5)
eGFR: 95 mL/min/{1.73_m2} (ref >59)

## 2022-07-16 LAB — HEMOGLOBIN A1C
Est. average glucose Bld gHb Est-mCnc: 117 mg/dL
Hgb A1c MFr Bld: 5.7 % — ABNORMAL HIGH (ref 4.8–5.6)

## 2022-07-16 LAB — LIPID PANEL
Chol/HDL Ratio: 2.7 ratio (ref 0.0–5.0)
Cholesterol, Total: 214 mg/dL — ABNORMAL HIGH (ref 100–199)
HDL: 78 mg/dL (ref >39)
LDL Chol Calc (NIH): 127 mg/dL — ABNORMAL HIGH (ref 0–99)
Triglycerides: 53 mg/dL (ref 0–149)
VLDL Cholesterol Cal: 9 mg/dL (ref 5–40)

## 2022-07-16 MED ORDER — ATORVASTATIN CALCIUM 10 MG PO TABS: 10.0000 mg | ORAL_TABLET | Freq: Every day | ORAL | 3 refills | Status: DC

## 2022-07-16 NOTE — Progress Notes (Signed)
Let pt know liver kidney normal, no diabetes, cholesterol is high, recommend starting low dose cholesterol pill sent to pharmacy, blood counts normal

## 2022-07-17 ENCOUNTER — Telehealth: Payer: Self-pay

## 2022-07-17 NOTE — Telephone Encounter (Signed)
Pt aunt was called(who is on the dpr) and is aware of results, DOB was confirmed.

## 2022-07-17 NOTE — Telephone Encounter (Signed)
-----   Message from Elsie Stain, MD sent at 07/16/2022  5:47 AM EST ----- Let pt know liver kidney normal, no diabetes, cholesterol is high, recommend starting low dose cholesterol pill sent to pharmacy, blood counts normal

## 2022-07-24 ENCOUNTER — Ambulatory Visit (HOSPITAL_COMMUNITY)
Admission: EM | Admit: 2022-07-24 | Discharge: 2022-07-24 | Disposition: A | Discharge status: Home / Self Care | Payer: No Payment, Other | Attending: Psychiatry | Admitting: Psychiatry

## 2022-07-24 DIAGNOSIS — E785 Hyperlipidemia, unspecified: Secondary | ICD-10-CM | POA: Insufficient documentation

## 2022-07-24 DIAGNOSIS — Z79899 Other long term (current) drug therapy: Secondary | ICD-10-CM | POA: Insufficient documentation

## 2022-07-24 DIAGNOSIS — F909 Attention-deficit hyperactivity disorder, unspecified type: Secondary | ICD-10-CM | POA: Insufficient documentation

## 2022-07-24 DIAGNOSIS — F69 Unspecified disorder of adult personality and behavior: Secondary | ICD-10-CM

## 2022-07-24 DIAGNOSIS — F32A Depression, unspecified: Secondary | ICD-10-CM | POA: Insufficient documentation

## 2022-07-24 DIAGNOSIS — F419 Anxiety disorder, unspecified: Secondary | ICD-10-CM | POA: Insufficient documentation

## 2022-07-24 NOTE — ED Triage Notes (Signed)
Pt presents to [facility] accompanied by his aunt seeking a mental health evaluation due to behavioral concern in an adult. Pts aunt states the pt has been verbally aggressive towards her and he has had issues in the past with maintaining a job and helping around the house. Pt appears to have some developmental or intellectual delays. Pt denies SI/HI and AVH

## 2022-07-24 NOTE — Discharge Instructions (Signed)
Please come to [facility] (this facility, SECOND FLOOR) during walk in hours for psychiatry and counseling.    Walk in hours for therapy/counseling: Monday through Thursday 7:30AM until slots are full. Every Friday 12PM until slots are full.   Walk in hours for psychiatry/medication management: Monday through Friday 7:30AM until slots are full.   When you arrive please take the elevators upstairs. If you are unsure of where to go, inform the front desk that you are here for open access hours and they will assist you with directions upstairs.  Walk ins spots are limited and are seen first come, first served. YOU MAY NOT BE SEEN THE SAME DAY YOU ARRIVE. To increase the likelihood of being seen the same day, please arrive early, such as by 7:15AM.  When you arrive, you may also ask to speak with Department of Social Services Eligibility Caseworker, Vicente Masson.  Office number: [phone] Email: [email or web address]  Address:  [address] ([place]), in [place], [place] Ph: [phone]
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
## 2022-07-31 ENCOUNTER — Encounter (HOSPITAL_COMMUNITY): Payer: Self-pay

## 2022-07-31 ENCOUNTER — Ambulatory Visit (INDEPENDENT_AMBULATORY_CARE_PROVIDER_SITE_OTHER): Payer: No Payment, Other | Admitting: Mental Health

## 2022-07-31 DIAGNOSIS — F89 Unspecified disorder of psychological development: Secondary | ICD-10-CM | POA: Insufficient documentation

## 2022-07-31 DIAGNOSIS — F39 Unspecified mood [affective] disorder: Secondary | ICD-10-CM | POA: Diagnosis not present

## 2022-08-03 ENCOUNTER — Other Ambulatory Visit: Payer: Self-pay

## 2022-08-03 ENCOUNTER — Other Ambulatory Visit: Payer: Self-pay | Admitting: Critical Care Medicine

## 2022-08-03 MED ORDER — SERTRALINE HCL 100 MG PO TABS
100.0000 mg | ORAL_TABLET | Freq: Every day | ORAL | 1 refills | Status: DC
  Filled 2022-08-03: qty 90, 90d supply, fill #0

## 2022-08-03 MED ORDER — ATORVASTATIN CALCIUM 10 MG PO TABS
10.0000 mg | ORAL_TABLET | Freq: Every day | ORAL | 1 refills | Status: DC
  Filled 2022-08-03: qty 90, 90d supply, fill #0

## 2022-08-03 NOTE — Progress Notes (Signed)
Comprehensive Clinical Assessment (CCA) Note  08/03/2022 Brent Brent [name] [id number]  Chief Complaint:  Chief Complaint  Patient presents with   Establish Care   Other    Developmental delays    Visit Diagnosis:  Unspecified affective disorder; Developmental Delays   CCA Screening, Triage and Referral (STR)  Patient Reported Information How did you hear about [place]? Family/Friend  Referral name: Brent Brent [name]  Whom do you see for routine medical problems? Primary Care  What Is the Reason for Your Visit/Call Today? "He is sporadic in his ways. Some days ok; some days no good. He will go into a fit, knock the table off the pedastal. He has a problem of lying. He lies about everything. It does not matter; he has to tell the truth." Unknown developmental concern present  How Long Has This Been Causing You Problems? > than 6 months  What Do You Feel Would Help You the Most Today? Treatment for Depression or other Brent [name] problem   Brent [name] You Recently Been in Any Inpatient Treatment (Hospital/Detox/Crisis Center/28-Day Program)? No  Brent [name] You Ever Received Services From [employer] Before? No  Brent [name] You Recently Had Any Thoughts About Hurting Yourself? No  Are You Planning to Commit Suicide/Harm Yourself At This time? No   Brent [name] you Recently Had Thoughts About [place]? No   Brent [name] You Used Any Alcohol or Drugs in the Past 24 Hours? No  How Long Ago Did You Use Drugs or Alcohol? No data recorded  Do You Currently Brent [name] a Brent Brent [name]/Psychiatrist? No  Brent [name] You Been Recently Discharged From Any Office Practice or Programs? No  Explanation of Discharge From Practice/Program: No data recorded    CCA Screening Triage Referral Assessment Type of Contact: Face-to-Face  Does Patient Brent [name] a Brent Brent [name]? No Is CPS involved or ever been involved? Never  Is APS involved or ever been involved? Never   Patient Determined To Be At Risk for Harm To Self or Others  Based on Review of Patient Reported Information or Presenting Complaint? No  Method: No Plan  Availability of Means: No access or NA  Intent: Vague intent or NA  Notification Required: No Brent [name] or identified person  Are There Guns or Other Weapons in Your Home? No  Do You Brent [name] any Outstanding Charges, Pending Court Dates, Parole/Probation? denies  Location of Assessment: [facility] Brent [name]   Does Patient Present under Involuntary Commitment? No  County of Residence: No data recorded  Patient Currently Receiving the Following Services: Not Receiving Services   Determination of Brent [name]: Routine (7 days)   Options For Referral: Medication Management     CCA Biopsychosocial Intake/Chief Complaint:  "He is sporadic in his ways. Some days ok; some days no good. He will go into a fit, knock the table off the pedastal. He has a problem of lying. He lies about everything. It does not matter; he has to tell the truth." - Per Aunt: Brent Brent [name].  Brent Brent [name] is a 47 year old African-American single male who presents for walk in routine assessment to engage in outpatient therapy services. Brent Brent [name] presents with his aunt Brent Brent [name] for assessment.  Brent Brent [name] denies to participate in assessment fully and refers to aunt who provides information for assessment. Brent Brent [name] shares for Brent Brent [name] to Brent [name] lived with her for the past x15 years and prior to Brent [name] lived with his grand-parents (her parents). Denies for Brent [name] to Brent [name] ever lived independently. It is unclear if psychological testing and formal diagnoses to Brent [name] been  provided in the past. Shares concerns for his Brent [name] to shift quickly and can be come upset. Shares has hit a table in the past and reports to hold lying behaviors. Brent Brent [name] for Brent Brent [name] to Brent [name] support in a variety of adult living tasks not able to cook or clean after self. Needs constant reminders and prompts in order to complete adult daily living tasks. Will fail to obtain needed items to toilet  properly, brush teeth, shower etc. Aunt also supports in money management.  Current Symptoms/Problems: labile Brent [name]   Patient Reported Schizophrenia/Schizoaffective Diagnosis in Past: No   Strengths: seeking treatment  Preferences: support  Abilities: unknown   Type of Services Patient Feels are Needed: additional supporjts   Initial Clinical Brent [name]/Concerns: develomental disorder   Mental Health Symptoms Depression:   Irritability (denies history of self harm; denies suicidal thoughts or actions)   Duration of Depressive symptoms: No data recorded  Mania:   Irritability   Anxiety:    None   Psychosis:   None   Duration of Psychotic symptoms: No data recorded  Trauma:   None   Obsessions:   None   Compulsions:   None   Inattention:   None; Disorganized; Does not follow instructions (not oppositional); Forgetful; Poor follow-through on tasks; Symptoms before age 5 (54 shares ADHD in childhood- shares to Brent [name] taken medication in childhood)   Hyperactivity/Impulsivity:   None   Oppositional/Defiant Behaviors:  No data recorded  Emotional Irregularity:  No data recorded  Other Brent [name]/Personality Symptoms:   shares can get angry easily at times and anger can be explosive. Per aunt- he hit the table    Mental Status Exam Appearance and self-care  Stature:   Tall   Weight:   Average weight   Clothing:   Casual   Grooming:   Normal   Cosmetic use:   None   Posture/gait:   Rigid   Motor activity:   Repetitive   Sensorium  Attention:   Unaware   Concentration:   Normal   Orientation:   X5   Recall/memory:   Normal   Affect and Brent [name]  Affect:   Appropriate (Smiles largel)   Brent [name]:   Other (Comment)   Relating  Eye contact:   Normal   Facial expression:   Responsive   Attitude toward examiner:   Dependent; Silly   Thought and Language  Speech flow:  Clear and Coherent; Slow   Thought content:   Appropriate to Brent [name] and  Circumstances   Preoccupation:   None   Hallucinations:   None   Organization:  No data recorded  [employer] of Knowledge:   Impoverished by (Comment)   Intelligence:   Needs investigation   Abstraction:   Concrete   Judgement:   Dangerous   Reality Testing:   Unaware   Insight:   Lacking; None/zero insight   Decision Making:   Only simple   Social Functioning  Social Maturity:   Responsible   Social Judgement:   Naive   Stress  Stressors:  No data recorded  Coping Ability:   Deficient supports   Skill Deficits:   Decision making; Intellect/education; Responsibility; Self-care; Activities of daily living; Communication   Supports:   Family; Support needed     Religion: Religion/Spirituality Are You A Religious Person?: Yes  Leisure/Recreation: Leisure / Recreation Do You Brent [name] Hobbies?: Yes Leisure and Hobbies: going to the mall and Art Brent Brent [name].  Exercise/Diet: Exercise/Diet Do You Exercise?: No Brent [name] You Gained or  Lost A Significant Amount of Weight in the Past Six Months?: No Do You Follow a Special Diet?: No Do You Brent [name] Any Trouble Sleeping?: Yes Explanation of Sleeping Difficulties: increased   CCA Employment/Education Employment/Work Situation: Employment / Work Situation Employment Situation: Employed (part time) Where is Patient Currently Employed?: [employer] How Long has Patient Been Employed?: August of 2023. Are You Satisfied With Your Job?: Yes Do You Work More Than One Job?: No Work Stressors: denies Patient's Job has Been Impacted by Current Illness: No What is the Longest Time Patient has Held a Job?: 6 years Where was the Patient Employed at that Time?: [place] [place] Has Patient ever Been in the [employer]?: No  Education: Education Is Patient Currently Attending School?: No Last Grade Completed: 12 Did Teacher, adult education From [employer]?: No Did You Nutritional Brent Brent [name]?: No Did Heritage manager?: No Did You  Brent [name] An Individualized Education Program (IIEP): No Did You Brent [name] Any Difficulty At School?: No Patient's Education Has Been Impacted by Current Illness: No   CCA Family/Childhood History Family and Relationship History: Family history Marital status: Single Are you sexually active?: No What is your sexual orientation?: heterosexual Has your sexual activity been affected by drugs, alcohol, medication, or emotional stress?: - Does patient Brent [name] children?: No  Childhood History:  Childhood History By whom was/is the patient raised?: Grandparents Additional childhood history information: Mother and father were together until he was 20 years of age and then began living with his grand-parents from there. Ladonta shares to Brent [name] been born in [place] and raised in [place]. Describes his childhood " I had a good childhood." Description of patient's relationship with caregiver when they were a child: Mother: "It was ok." Father: "Pretty nice." Patient's description of current relationship with people who raised him/her: Mother: "It's ok." Aunt shares to not be aware of mother is. Father: "Good contact with my daddy. Per aunt lives in nursing facility. How were you disciplined when you got in trouble as a child/adolescent?: unable to report. Does patient Brent [name] siblings?: Yes Number of Siblings: 3 (x 2 brothers and x 1 sister) Description of patient's current relationship with siblings: Shares to get along well with siblings. Did patient suffer any verbal/emotional/physical/sexual abuse as a child?: No Did patient suffer from severe childhood neglect?: No Has patient ever been sexually abused/assaulted/raped as an adolescent or adult?: No Was the patient ever a victim of a crime or a disaster?: No Witnessed domestic violence?: No Has patient been affected by domestic violence as an adult?: No  Child/Adolescent Assessment:     CCA Substance Use Alcohol/Drug Use: Alcohol / Drug  Use Prescriptions: Zoloft 100mg  History of alcohol / drug use?: No history of alcohol / drug abuse                         ASAM's:  Six Dimensions of Multidimensional Assessment  Dimension 1:  Acute Intoxication and/or Withdrawal Potential:      Dimension 2:  Biomedical Conditions and Complications:      Dimension 3:  Emotional, Behavioral, or Cognitive Conditions and Complications:     Dimension 4:  Readiness to Change:     Dimension 5:  Relapse, Continued use, or Continued Problem Potential:     Dimension 6:  Recovery/Living Environment:     ASAM Severity Score:    ASAM Recommended Level of Treatment:     Substance use Disorder (SUD)    Recommendations for Services/Supports/Treatments: Recommendations for Services/Supports/Treatments  Recommendations For Services/Supports/Treatments: Medication Management  DSM5 Diagnoses: Patient Active Problem List   Diagnosis Date Noted   Unspecified Brent [name] (affective) disorder ([place]) 07/31/2022   Developmental disorder 07/31/2022   Hyperlipidemia 07/16/2022   Stage 1 presymptomatic normoglycemic type 1 prediabetes 07/15/2022   History of dental problems 02/12/2022   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 Brent Brent [name] presents with his aunt Brent Brent [name] for assessment. Brent Brent [name] denies to participate in assessment fully and refers to aunt who provides information for assessment. Brent Brent [name] shares for Brent Brent [name] lived with her for the past x15 years and prior to Brent [name] lived with his grand-parents (her parents). Denies for Brent Brent [name] to Brent [name] ever lived independently. It is unclear if psychological testing and formal diagnoses to Brent [name] been provided in the past. Shares concerns for his Brent [name] to shift quickly and can be come upset. Shares has hit a table in the past  and reports to hold lying behaviors. Brent Brent [name] for Brent Brent [name] support in a variety of adult living tasks not able to cook or clean after self. Needs constant reminders and prompts in order to complete adult daily living tasks. Will fail to obtain needed items to toilet properly, brush teeth, shower etc. Aunt also supports in money management.   Brent Brent [name] presents for assessment alert and oriented. Brent [name] and affect adequate. Speech clear and coherent at normal rate and tone. Pleasant demeanor. Smiles frequently throughout assessment and looks and defers to aunt for some line of questioning asked by Brent Brent [name]. Brent Brent [name] some feelings of irritability and denies concerns for Brent [name] otherwise. Denies depression, denies anxiety. Per aunt possible diagnosis of ADHD in childhood and shares disorganization, poor follow through and often forgetful. Denies psychotic sxs; denies trauma related sxs. Aunt shares for Brent Brent [name] to Brent [name] labile moods in which he will become increasingly agitated and shares for him to hit a table one time in the past, slamming things with concerns for his ability to manage anger. Brent Brent [name] often responds to clinician line of questioning with 'no' with aunt sharing for responses to questions to not be valid. Aunt Brent [name] poor attendance to adult daily life tasks and has not been able to live independently. Brent [name] concerns for lying behaviors. Shares to feel as if he needs high degree of guidance. Shares for him to be engaged in part-time work. No legal concerns. No legal guardian or POA. No disability benefits. No reported history of substance use although aunt shares event where he went off went co-works and was drinking beer; possibly easily influence. Denies social interactions with others. Denies history of marriages, no children. Denies SI/HI/AVH. CSSRS, pain, nutrition, GAD and PHQ completed.  Inappropriate for OPT PHQ: 14 GAD: 0 Patient Centered Plan: Patient is on the following Treatment Plan(s):   Impulse Control   Referrals to Alternative Service(s): Referred to Alternative Service(s):   Place:   Date:   Time:    Referred to Alternative Service(s):   Place:   Date:   Time:    Referred to Alternative Service(s):   Place:   Date:   Time:    Referred to Alternative Service(s):   Place:   Date:   Time:      Collaboration of Care: Referred for Psychological evaluation and Psychiatric Evaluation.   Patient/Guardian was advised Release of Information must be obtained prior to any  record release in order to collaborate their care with an outside provider. Patient/Guardian was advised if they Brent [name] not already done so to contact the registration department to sign all necessary forms in order for [place] to release information regarding their care.   Consent: Patient/Guardian gives verbal consent for treatment and assignment of benefits for services provided during this visit. Patient/Guardian expressed understanding and agreed to proceed.   Marion Downer, [facility]

## 2022-08-06 ENCOUNTER — Other Ambulatory Visit: Payer: Self-pay
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
## 2022-09-01 ENCOUNTER — Ambulatory Visit (HOSPITAL_COMMUNITY): Payer: Medicaid Other | Admitting: Psychiatry

## 2022-10-28 ENCOUNTER — Ambulatory Visit (HOSPITAL_COMMUNITY): Payer: Medicaid Other | Admitting: Student in an Organized Health Care Education/Training Program

## 2022-10-28 ENCOUNTER — Encounter (HOSPITAL_COMMUNITY): Payer: Self-pay | Admitting: Student in an Organized Health Care Education/Training Program

## 2022-10-28 VITALS — BP 134/90 | HR 74 | Resp 16 | Wt 139.0 lb

## 2022-10-28 DIAGNOSIS — F639 Impulse disorder, unspecified: Secondary | ICD-10-CM

## 2022-10-28 DIAGNOSIS — F09 Unspecified mental disorder due to known physiological condition: Secondary | ICD-10-CM

## 2022-10-28 DIAGNOSIS — F419 Anxiety disorder, unspecified: Secondary | ICD-10-CM

## 2022-10-28 MED ORDER — CLONIDINE HCL 0.1 MG PO TABS: 0.1000 mg | ORAL_TABLET | Freq: Every day | ORAL | 1 refills | Status: DC

## 2022-10-28 MED ORDER — SERTRALINE HCL 50 MG PO TABS
50.0000 mg | ORAL_TABLET | Freq: Every day | ORAL | 2 refills | Status: DC
Start: 2022-10-28 — End: 2022-11-04
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
## 2022-10-28 NOTE — Addendum Note (Signed)
Addended by: Eliseo Gum B on: 10/28/2022 01:18 PM   Modules accepted: Level of Service

## 2022-11-04 ENCOUNTER — Other Ambulatory Visit: Payer: Self-pay

## 2022-11-04 ENCOUNTER — Ambulatory Visit: Payer: Medicaid Other | Attending: Critical Care Medicine | Admitting: Critical Care Medicine

## 2022-11-04 ENCOUNTER — Encounter: Payer: Self-pay | Admitting: Critical Care Medicine

## 2022-11-04 VITALS — BP 122/85 | HR 71 | Wt 138.0 lb

## 2022-11-04 DIAGNOSIS — F39 Unspecified mood [affective] disorder: Secondary | ICD-10-CM

## 2022-11-04 DIAGNOSIS — R4189 Other symptoms and signs involving cognitive functions and awareness: Secondary | ICD-10-CM | POA: Insufficient documentation

## 2022-11-04 DIAGNOSIS — F09 Unspecified mental disorder due to known physiological condition: Secondary | ICD-10-CM | POA: Diagnosis not present

## 2022-11-04 DIAGNOSIS — R4587 Impulsiveness: Secondary | ICD-10-CM | POA: Diagnosis not present

## 2022-11-04 DIAGNOSIS — F32A Depression, unspecified: Secondary | ICD-10-CM

## 2022-11-04 DIAGNOSIS — E782 Mixed hyperlipidemia: Secondary | ICD-10-CM

## 2022-11-04 DIAGNOSIS — F419 Anxiety disorder, unspecified: Secondary | ICD-10-CM | POA: Diagnosis not present

## 2022-11-04 DIAGNOSIS — F639 Impulse disorder, unspecified: Secondary | ICD-10-CM

## 2022-11-04 DIAGNOSIS — F88 Other disorders of psychological development: Secondary | ICD-10-CM | POA: Diagnosis not present

## 2022-11-04 MED ORDER — CETIRIZINE HCL 10 MG PO TABS
10.0000 mg | ORAL_TABLET | Freq: Every day | ORAL | 2 refills | Status: DC
  Filled 2022-11-04: qty 90, 90d supply, fill #0
  Filled 2023-03-29: qty 30, 30d supply, fill #0
  Filled 2023-07-14: qty 30, 30d supply, fill #1
  Filled 2023-09-30: qty 30, 30d supply, fill #2

## 2022-11-04 MED ORDER — SERTRALINE HCL 50 MG PO TABS
50.0000 mg | ORAL_TABLET | Freq: Every day | ORAL | 2 refills | Status: DC
  Filled 2022-11-04: qty 90, 90d supply, fill #0
  Filled 2022-12-14: qty 60, 60d supply, fill #0

## 2022-11-04 MED ORDER — CLONIDINE HCL 0.1 MG PO TABS
0.1000 mg | ORAL_TABLET | Freq: Every day | ORAL | 2 refills | Status: DC
  Filled 2022-11-04: qty 90, 90d supply, fill #0
  Filled 2022-12-14 – 2022-12-29 (×2): qty 60, 60d supply, fill #0
  Filled 2023-03-29 (×2): qty 60, 60d supply, fill #1

## 2022-11-04 MED ORDER — ATORVASTATIN CALCIUM 10 MG PO TABS
10.0000 mg | ORAL_TABLET | Freq: Every day | ORAL | 1 refills | Status: DC
  Filled 2022-11-04 – 2022-12-14 (×2): qty 90, 90d supply, fill #0

## 2022-11-04 NOTE — Assessment & Plan Note (Signed)
Patient is yet to pick up the reduced dose of sertraline recommended he do this

## 2022-11-04 NOTE — Assessment & Plan Note (Signed)
Continue with atorvastatin 

## 2022-11-04 NOTE — Assessment & Plan Note (Signed)
Patient needs behavioral therapy with a psychologist will recommend he see licensed clinical social work and make another referral to the behavioral center  Patient also needs neurologic evaluation of his memory decline will refer to an outside neurology practice as the internal medical group has declined to see him
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
## 2022-11-04 NOTE — Patient Instructions (Addendum)
I have asked our referral coordinator to find another neurology practice that we will see Edger Fennewald have asked [facility] behavioral health to provide [place] a therapist they will call you  Return to Dr. Delford Field 4 months  All medications recently prescribed to the [facility] will be changed to our pharmacy downstairs

## 2022-11-11 ENCOUNTER — Other Ambulatory Visit: Payer: Self-pay

## 2022-12-14 ENCOUNTER — Other Ambulatory Visit: Payer: Self-pay

## 2022-12-16 ENCOUNTER — Other Ambulatory Visit (HOSPITAL_COMMUNITY): Payer: Self-pay

## 2022-12-16 ENCOUNTER — Other Ambulatory Visit: Payer: Self-pay

## 2022-12-17 ENCOUNTER — Other Ambulatory Visit: Payer: Self-pay

## 2022-12-21 ENCOUNTER — Other Ambulatory Visit: Payer: Self-pay

## 2022-12-21 ENCOUNTER — Ambulatory Visit (HOSPITAL_COMMUNITY): Payer: No Payment, Other | Admitting: Clinical

## 2022-12-28 ENCOUNTER — Other Ambulatory Visit: Payer: Self-pay

## 2022-12-29 ENCOUNTER — Other Ambulatory Visit: Payer: Self-pay

## 2023-01-01 ENCOUNTER — Encounter (HOSPITAL_COMMUNITY): Payer: MEDICAID | Admitting: Student

## 2023-01-12 ENCOUNTER — Ambulatory Visit (INDEPENDENT_AMBULATORY_CARE_PROVIDER_SITE_OTHER): Payer: MEDICAID | Admitting: Student

## 2023-01-12 ENCOUNTER — Encounter (HOSPITAL_COMMUNITY): Payer: Self-pay | Admitting: Student

## 2023-01-12 VITALS — BP 124/92 | HR 76 | Ht 72.0 in | Wt 137.0 lb

## 2023-01-12 DIAGNOSIS — F639 Impulse disorder, unspecified: Secondary | ICD-10-CM | POA: Diagnosis not present

## 2023-01-12 DIAGNOSIS — F419 Anxiety disorder, unspecified: Secondary | ICD-10-CM | POA: Diagnosis not present

## 2023-01-12 DIAGNOSIS — F09 Unspecified mental disorder due to known physiological condition: Secondary | ICD-10-CM | POA: Diagnosis not present

## 2023-01-12 DIAGNOSIS — F32A Depression, unspecified: Secondary | ICD-10-CM | POA: Diagnosis not present
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
## 2023-01-19 NOTE — Addendum Note (Signed)
Addended by: Theodoro Kos A on: 01/19/2023 11:20 AM   Modules accepted: Level of Service

## 2023-02-16 ENCOUNTER — Ambulatory Visit (INDEPENDENT_AMBULATORY_CARE_PROVIDER_SITE_OTHER): Payer: MEDICAID | Admitting: Clinical

## 2023-02-16 ENCOUNTER — Other Ambulatory Visit: Payer: Self-pay

## 2023-02-16 DIAGNOSIS — F39 Unspecified mood [affective] disorder: Secondary | ICD-10-CM
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
## 2023-02-23 ENCOUNTER — Other Ambulatory Visit: Payer: Self-pay

## 2023-02-23 ENCOUNTER — Encounter (HOSPITAL_COMMUNITY): Payer: MEDICAID | Admitting: Student

## 2023-02-23 MED ORDER — COMIRNATY 30 MCG/0.3ML IM SUSY
PREFILLED_SYRINGE | INTRAMUSCULAR | 0 refills | Status: DC
  Filled 2023-02-23: qty 0.3, 1d supply, fill #0

## 2023-02-23 MED ORDER — FLULAVAL 0.5 ML IM SUSY
PREFILLED_SYRINGE | INTRAMUSCULAR | 0 refills | Status: DC
  Filled 2023-02-23: qty 0.5, 1d supply, fill #0

## 2023-03-02 ENCOUNTER — Encounter (HOSPITAL_COMMUNITY): Payer: No Payment, Other | Admitting: Student

## 2023-03-10 ENCOUNTER — Encounter: Payer: Self-pay | Admitting: Physician Assistant

## 2023-03-10 ENCOUNTER — Ambulatory Visit: Payer: MEDICAID | Attending: Critical Care Medicine | Admitting: Physician Assistant

## 2023-03-10 ENCOUNTER — Other Ambulatory Visit: Payer: Self-pay

## 2023-03-10 VITALS — BP 123/85 | HR 77 | Wt 136.8 lb

## 2023-03-10 DIAGNOSIS — R7303 Prediabetes: Secondary | ICD-10-CM

## 2023-03-10 DIAGNOSIS — E782 Mixed hyperlipidemia: Secondary | ICD-10-CM | POA: Diagnosis not present

## 2023-03-10 DIAGNOSIS — F09 Unspecified mental disorder due to known physiological condition: Secondary | ICD-10-CM

## 2023-03-10 DIAGNOSIS — F39 Unspecified mood [affective] disorder: Secondary | ICD-10-CM

## 2023-03-10 MED ORDER — ATORVASTATIN CALCIUM 10 MG PO TABS
10.0000 mg | ORAL_TABLET | Freq: Every day | ORAL | 1 refills | Status: DC
Start: 2023-03-10 — End: 2023-07-14
  Filled 2023-03-10 – 2023-03-29 (×2): qty 90, 90d supply, fill #0

## 2023-03-10 MED ORDER — ATORVASTATIN CALCIUM 10 MG PO TABS: 10.0000 mg | ORAL_TABLET | Freq: Every day | ORAL | 1 refills | Status: DC

## 2023-03-10 NOTE — Progress Notes (Signed)
Patient ID: YEELENG SMET, male   DOB: 06-11-1975, 47 y.o.   MRN: [id number]   Brent [name], is a 47 y.o. male  MWN:[id number]  QIH:[id number]  DOB - 1976-01-29  Chief Complaint  Patient presents with   Medical Management of Chronic Issues       Subjective:   Brent [name] is a 47 y.o. male here today for a follow up visit since being started on cholesterol meds in February.  He has been compliant on atorvastatin.  He has not made diet changes related to sugar and starchy food intake.    Paternal aunt states He has been following up with behavioral health but has been taking things(money from drawers,etc)  then he will also carry around papers that were thrown in the trash.  She says he has been more defiant and rolls his eyes at her.  He has not been physical with her.    He denies SI/HI  No problems updated.  ALLERGIES: No Known Allergies  PAST MEDICAL HISTORY: Past Medical History:  Diagnosis Date   Anxiety    Depression    Hepatitis B    Hyperlipidemia 07/16/2022    MEDICATIONS AT HOME: Prior to Admission medications   Medication Sig Start Date End Date Taking? Authorizing Provider  cetirizine (ZYRTEC ALLERGY) 10 MG tablet Take 1 tablet (10 mg total) by mouth daily. 11/04/22  Yes Storm Frisk, MD  cloNIDine (CATAPRES) 0.1 MG tablet Take 1 tablet (0.1 mg total) by mouth daily. 11/04/22  Yes Storm Frisk, MD  COVID-19 mRNA vaccine, Pfizer, (COMIRNATY) syringe Inject into the muscle. 02/23/23  Yes Judyann Munson, MD  influenza vac split trivalent PF (FLULAVAL) 0.5 ML injection Inject into the muscle. 02/23/23  Yes Judyann Munson, MD  Multiple Vitamin (MULTIVITAMIN ADULT) TABS Take 1 tablet by mouth daily.   Yes [provider]  sertraline (ZOLOFT) 50 MG tablet Take 1 tablet (50 mg total) by mouth daily. 11/04/22  Yes Storm Frisk, MD  atorvastatin (LIPITOR) 10 MG tablet Take 1 tablet (10 mg total) by mouth daily. 03/10/23   Lorali Khamis, Marzella Schlein, PA-C     ROS: Neg HEENT Neg resp Neg cardiac Neg GI Neg GU Neg MS Neg psych Neg neuro  Objective:   Vitals:   03/10/23 0909  BP: 123/85  Pulse: 77  SpO2: 99%  Weight: 136 lb 12.8 oz (62.1 kg)   Exam General appearance : Awake, alert, not in any distress. Speech Clear. Not toxic looking.  Blunted to flat affect.  Paternal aunt does most of the talking but the patient will respond HEENT: Atraumatic and Normocephalic Neck: Supple, no JVD. No cervical lymphadenopathy.  Chest: Good air entry bilaterally, CTAB.  No rales/rhonchi/wheezing CVS: S1 S2 regular, no murmurs.  Extremities: B/L Lower Ext shows no edema, both legs are warm to touch Neurology: Awake alert, and oriented X 3, CN II-XII intact, Non focal Skin: No Rash  Data Review Lab Results  Component Value Date   HGBA1C 5.7 (H) 07/15/2022   HGBA1C 5.8 (H) 01/30/2021   HGBA1C 5.8 (H) 02/09/2020    Assessment & Plan   1. Cognitive and neurobehavioral dysfunction Followed by [facility] and Continue clonidine and sertraline 50mg  and follow up with Behavioral health  2. Mixed hyperlipidemia Labs have not been checked since starting on atorvastatin - CMP14+EGFR - Lipid Panel - atorvastatin (LIPITOR) 10 MG tablet; Take 1 tablet (10 mg total) by mouth daily.  Dispense: 90 tablet; Refill: 1  3. Unspecified mood (affective)  disorder (HCC) Continue clonidine and sertraline 50mg  and follow up with Behavioral health  4. Prediabetes Decrease sugar and starchy food intake - CMP14+EGFR - Hemoglobin A1c    Return in about 4 months (around 07/11/2023) for please assign him a PCP (previous Dr Delford Field).  The patient was given clear instructions to go to ER or return to medical center if symptoms don't improve, worsen or new problems develop. The patient verbalized understanding. The patient was told to call to get lab results if they haven't heard anything in the next week.      Georgian Co, PA-C [facility] and  [facility] [address], [place] [phone]   03/10/2023, 9:40 AM

## 2023-03-10 NOTE — Patient Instructions (Addendum)
Please call and schedule appointments with [facility] for follow up with a counselor AND a psychiatrist

## 2023-03-11 LAB — CMP14+EGFR
ALT: 20 [IU]/L (ref 0–44)
AST: 30 [IU]/L (ref 0–40)
Albumin: 4.5 g/dL (ref 4.1–5.1)
Alkaline Phosphatase: 88 [IU]/L (ref 44–121)
BUN/Creatinine Ratio: 19 (ref 9–20)
BUN: 19 mg/dL (ref 6–24)
Bilirubin Total: 0.4 mg/dL (ref 0.0–1.2)
CO2: 20 mmol/L (ref 20–29)
Calcium: 9.6 mg/dL (ref 8.7–10.2)
Chloride: 108 mmol/L — ABNORMAL HIGH (ref 96–106)
Creatinine, Ser: 1 mg/dL (ref 0.76–1.27)
Globulin, Total: 2.5 g/dL (ref 1.5–4.5)
Glucose: 63 mg/dL — ABNORMAL LOW (ref 70–99)
Potassium: 4.9 mmol/L (ref 3.5–5.2)
Sodium: 147 mmol/L — ABNORMAL HIGH (ref 134–144)
Total Protein: 7 g/dL (ref 6.0–8.5)
eGFR: 95 mL/min/{1.73_m2} (ref >59)

## 2023-03-11 LAB — HEMOGLOBIN A1C
Est. average glucose Bld gHb Est-mCnc: 128 mg/dL
Hgb A1c MFr Bld: 6.1 % — ABNORMAL HIGH (ref 4.8–5.6)

## 2023-03-11 LAB — LIPID PANEL
Chol/HDL Ratio: 2.4 ratio (ref 0.0–5.0)
Cholesterol, Total: 186 mg/dL (ref 100–199)
HDL: 77 mg/dL (ref >39)
LDL Chol Calc (NIH): 96 mg/dL (ref 0–99)
Triglycerides: 72 mg/dL (ref 0–149)
VLDL Cholesterol Cal: 13 mg/dL (ref 5–40)

## 2023-03-12 ENCOUNTER — Telehealth: Payer: Self-pay

## 2023-03-12 NOTE — Telephone Encounter (Signed)
-----   Message from Georgian Co sent at 03/11/2023 10:35 AM EDT ----- Please call patient.  His sodium levels are a little elevated.  His prediabetes has also increased.  Please ask him to limit sugar and salt and to drink 80 ounces water daily.  Cholesterol has improved-continue atorvastatin.  Thanks, Georgian Co, PA-C

## 2023-03-12 NOTE — Telephone Encounter (Addendum)
Pt aunt was called(who is on dpr) and is aware of results, DOB was confirmed.

## 2023-03-23 ENCOUNTER — Other Ambulatory Visit: Payer: Self-pay

## 2023-03-25 ENCOUNTER — Other Ambulatory Visit: Payer: Self-pay

## 2023-03-29 ENCOUNTER — Other Ambulatory Visit: Payer: Self-pay

## 2023-04-06 ENCOUNTER — Other Ambulatory Visit: Payer: Self-pay

## 2023-05-04 ENCOUNTER — Ambulatory Visit: Payer: Self-pay

## 2023-05-04 NOTE — Telephone Encounter (Signed)
Chief Complaint: BH problem  Symptoms: agitated, hateful, cursing  Frequency: several weeks  Pertinent Negatives: Aunt denies pt having SI or HI Disposition: [] ED /[] Urgent Care (no appt availability in office) / [] Appointment(In office/virtual)/ []  [place] [facility]/ [] Home Care/ [] Refused Recommended Disposition /[] [place] [facility]/ []  Follow-up with PCP Additional Notes: spoke with [name], pt's aunt. She states that she is unsure if Sertraline is helping pt or having adverse reactions on him. He is cursing at her and being hateful and she has never had to deal with this from anyone in her family and is not comfortable handling this situation. She states pt lives with her now for quite some time and unsure what to do for him. She is wanting him help and goes to [facility] on [address] but feels like they just ask the same questions each time. Dominique, [employer] agent did schedule pt for OV on 05/10/23 at 0850 with Marylene Land, PA. Broadus John to try [facility] on [address] again and see if they can offer different help or suggestions for pt and to keep appt as well. She verbalized understanding.   Reason for Disposition  [1] Bipolar disorder (manic depression) AND [2] worsening (e.g., thinking less clearly, more agitated, less able to do activities of daily living)  Answer Assessment - Initial Assessment Questions 1. CONCERN: "What happened that made you call today?"     Pt just acting ugly and cursing and being hateful to aunt  2. BIPOLAR SYMPTOM SCREENING: "How are you feeling overall, is your bipolar disorder under good control?"   - MANIA SYMPTOMS (e.g., increased energy, decreased sleeping, hyperactivity, grandiosity)    - DEPRESSION SYMPTOMS (e.g., decreased energy, increased sleeping or difficulty sleeping, difficulty concentrating, feelings of sadness, guilt, hopelessness, or worthlessness)     Anger sx  3. RISK OF HARM - SUICIDAL IDEATION:  "Do you ever have thoughts of hurting or killing  yourself?"  (e.g., yes, no, no but preoccupation with thoughts about death)   - INTENT:  "Do you have thoughts of hurting or killing yourself right NOW?" (e.g., yes, no, N/A)   - PLAN: "Do you have a specific plan for how you would do this?" (e.g., gun, knife, overdose, no plan, N/A)     no 4. RISK OF HARM - HOMICIDAL IDEATION:  "Do you ever have thoughts of hurting or killing someone else?"  (e.g., yes, no, no but preoccupation with thoughts about death)   - INTENT:  "Do you have thoughts of hurting or killing someone right NOW?" (e.g., yes, no, N/A)   - PLAN: "Do you have a specific plan for how you would do this?" (e.g., gun, knife, no plan, N/A)      no 5. FUNCTIONAL IMPAIRMENT: "How have things been going for you overall? Have you had more difficulty than usual doing your normal daily activities?"  (e.g., better, same, worse; self-care, school, work, interactions)     Pt doesn't say anything unless he absolutely wants to and doesn't want to change clothes  6. SUPPORT: "Who is with you now?" "Who do you live with?" "Do you have family or friends who you can talk to?"      Both Aunts and Uncles 7. THERAPIST: "Do you have a counselor or therapist? Name?"     Has BH counselor  8. STRESSORS: "Has there been any new stress or recent changes in your life?"     No lives with Aunt,  10. OTHER: "Do you have any other physical symptoms  right now?" (e.g., fever)       no  Protocols used: Bipolar Disorder (Manic Depression)-A-AH

## 2023-05-10 ENCOUNTER — Ambulatory Visit: Payer: MEDICAID | Admitting: Physician Assistant

## 2023-06-09 ENCOUNTER — Ambulatory Visit: Payer: MEDICAID | Admitting: Physician Assistant

## 2023-06-25 ENCOUNTER — Other Ambulatory Visit: Payer: Self-pay

## 2023-06-25 ENCOUNTER — Ambulatory Visit (INDEPENDENT_AMBULATORY_CARE_PROVIDER_SITE_OTHER): Payer: MEDICAID | Admitting: Student

## 2023-06-25 VITALS — BP 149/97 | HR 61 | Ht 72.0 in | Wt 140.8 lb

## 2023-06-25 DIAGNOSIS — F09 Unspecified mental disorder due to known physiological condition: Secondary | ICD-10-CM

## 2023-06-25 DIAGNOSIS — F32A Depression, unspecified: Secondary | ICD-10-CM

## 2023-06-25 DIAGNOSIS — F39 Unspecified mood [affective] disorder: Secondary | ICD-10-CM | POA: Diagnosis not present

## 2023-06-25 DIAGNOSIS — F639 Impulse disorder, unspecified: Secondary | ICD-10-CM

## 2023-06-25 DIAGNOSIS — F419 Anxiety disorder, unspecified: Secondary | ICD-10-CM | POA: Diagnosis not present

## 2023-06-25 MED ORDER — CLONIDINE HCL 0.2 MG PO TABS
0.2000 mg | ORAL_TABLET | Freq: Every day | ORAL | 1 refills | Status: DC
Start: 2023-06-25 — End: 2023-11-18
  Filled 2023-06-25 – 2023-07-14 (×2): qty 30, 30d supply, fill #0
  Filled 2023-09-30: qty 30, 30d supply, fill #1

## 2023-06-25 MED ORDER — SERTRALINE HCL 50 MG PO TABS
50.0000 mg | ORAL_TABLET | Freq: Every day | ORAL | 1 refills | Status: DC
  Filled 2023-06-25 – 2023-11-12 (×2): qty 30, 30d supply, fill #0
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
## 2023-06-26 ENCOUNTER — Encounter (HOSPITAL_COMMUNITY): Payer: Self-pay | Admitting: Student

## 2023-07-12 ENCOUNTER — Other Ambulatory Visit: Payer: Self-pay

## 2023-07-14 ENCOUNTER — Ambulatory Visit: Payer: MEDICAID | Attending: Family Medicine | Admitting: Family Medicine

## 2023-07-14 ENCOUNTER — Encounter: Payer: Self-pay | Admitting: Family Medicine

## 2023-07-14 ENCOUNTER — Other Ambulatory Visit: Payer: Self-pay

## 2023-07-14 ENCOUNTER — Telehealth: Payer: Self-pay | Admitting: Licensed Clinical Social Worker

## 2023-07-14 VITALS — BP 131/81 | HR 64 | Ht 72.0 in | Wt 145.2 lb

## 2023-07-14 DIAGNOSIS — E782 Mixed hyperlipidemia: Secondary | ICD-10-CM | POA: Diagnosis not present

## 2023-07-14 DIAGNOSIS — Z1211 Encounter for screening for malignant neoplasm of colon: Secondary | ICD-10-CM

## 2023-07-14 DIAGNOSIS — R7303 Prediabetes: Secondary | ICD-10-CM | POA: Diagnosis not present

## 2023-07-14 DIAGNOSIS — F09 Unspecified mental disorder due to known physiological condition: Secondary | ICD-10-CM

## 2023-07-14 MED ORDER — ATORVASTATIN CALCIUM 10 MG PO TABS
10.0000 mg | ORAL_TABLET | Freq: Every day | ORAL | 1 refills | Status: DC
  Filled 2023-07-14: qty 90, 90d supply, fill #0
  Filled 2023-09-30: qty 90, 90d supply, fill #1

## 2023-07-14 NOTE — Telephone Encounter (Signed)
 Met with patient and his parents today in regards to providing them with some important resources for services and important life skills.  Therapist contacted his insurance company to link patient with his case Production designer, theatre/television/film.  Therapist was able to speak with Brent [name] who is the case manager supervisor her office number is [phone] however it was suggested to call and leave a voicemail on her cell at [phone] [email or web address].  Therapist called both numbers and left a voicemail on the cell and ask for Ms. Brent [name] to give [place] a call back.  Therapist also shared this exact information with Brent [name] and aunt.  Therapist also referred family to [facility] and [facility] [address]. For additional support, physiatrist, etc.

## 2023-07-14 NOTE — Progress Notes (Signed)
 Subjective:  Patient ID: Brent [name], male    DOB: 1975/07/10  Age: 48 y.o. MRN: [id number]  CC: Medical Management of Chronic Issues   HPI: Brent [name] is a 48 y.o. year old male with a medical history significant for progressive cognitive and neurobehavioral dysfunction (difficulty with executive functioning, adhering to rules, losing items, anger impaired impulse control) followed by [facility], hyperlipidemia  Discussed the use of AI scribe software for clinical note transcription with the patient, who gave verbal consent to proceed.  History of Present Illness Brent [name], a patient with a history of high cholesterol and behavioral concerns, presents with his aunt, Brent [name]. The patient has been prescribed atorvastatin for his high cholesterol. He has also been seeing a psychiatrist for his behavioral concerns and has been prescribed clonidine and Zoloft, last visit was in 10/2022. Last therapy session was in 02/2023 but Aunt is does not feel he is receiving adequate care. His aunt reports that his moods are sporadic and she doubts the effectiveness of the medications. She also mentions that the patient has a tendency to lie, which makes it difficult to ascertain whether he is taking his medications as prescribed. The patient also has pre-diabetes, which is being monitored. He works at [employer] and his aunt expresses concern about his diet, particularly his consumption of sweets and sodas. He had been given a stool card for screening, which he has not yet completed.    Past Medical History:  Diagnosis Date   Anxiety    Depression    Hepatitis B    Hyperlipidemia 07/16/2022    Past Surgical History:  Procedure Laterality Date   NO PAST SURGERIES      Family History  Problem Relation Age of Onset   Diabetes Father    Hypertension Father    Ovarian cancer Paternal Grandmother    Prostate cancer Paternal Grandfather     Social History   Socioeconomic History    Marital status: Single    Spouse name: Not on file   Number of children: 0   Years of education: Not on file   Highest education level: Not on file  Occupational History   Occupation: unemployed  Tobacco Use   Smoking status: Never   Smokeless tobacco: Never  Vaping Use   Vaping status: Never Used  Substance and Sexual Activity   Alcohol use: Never   Drug use: No   Sexual activity: Never  Other Topics Concern   Not on file  Social History Narrative   Right handed   Social Drivers of Health   Financial Resource Strain: Low Risk  (07/31/2022)   Overall Financial Resource Strain (CARDIA)    Difficulty of Paying Living Expenses: Not hard at all  Food Insecurity: Food Insecurity Present (07/31/2022)   Hunger Vital Sign    Worried About Running Out of Food in the Last Year: Sometimes true    Ran Out of Food in the Last Year: Sometimes true  Transportation Needs: No Transportation Needs (07/31/2022)   PRAPARE - Administrator, Civil Service (Medical): No    Lack of Transportation (Non-Medical): No  Physical Activity: Insufficiently Active (07/31/2022)   Exercise Vital Sign    Days of Exercise per Week: 3 days    Minutes of Exercise per Session: 30 min  Stress: No Stress Concern Present (07/31/2022)   [employer] of Occupational Health - Occupational Stress Questionnaire    Feeling of Stress : Not at all  Social  Connections: Moderately Isolated (07/31/2022)   Social Connection and Isolation Panel [NHANES]    Frequency of Communication with Friends and Family: Twice a week    Frequency of Social Gatherings with Friends and Family: Three times a week    Attends Religious Services: 1 to 4 times per year    Active Member of Clubs or Organizations: No    Attends Banker Meetings: Never    Marital Status: Never married    No Known Allergies  Outpatient Medications Prior to Visit  Medication Sig Dispense Refill   cetirizine (ZYRTEC ALLERGY) 10 MG tablet  Take 1 tablet (10 mg total) by mouth daily. 30 tablet 2   cloNIDine (CATAPRES) 0.2 MG tablet Take 1 tablet (0.2 mg total) by mouth daily. 30 tablet 1   Multiple Vitamin (MULTIVITAMIN ADULT) TABS Take 1 tablet by mouth daily.     sertraline (ZOLOFT) 50 MG tablet Take 1 tablet (50 mg total) by mouth daily. 30 tablet 1   atorvastatin (LIPITOR) 10 MG tablet Take 1 tablet (10 mg total) by mouth daily. 90 tablet 1   No facility-administered medications prior to visit.     ROS Review of Systems  Constitutional:  Negative for activity change and appetite change.  HENT:  Negative for sinus pressure and sore throat.   Respiratory:  Negative for chest tightness, shortness of breath and wheezing.   Cardiovascular:  Negative for chest pain and palpitations.  Gastrointestinal:  Negative for abdominal distention, abdominal pain and constipation.  Genitourinary: Negative.   Musculoskeletal: Negative.   Psychiatric/Behavioral:  Negative for behavioral problems and dysphoric mood.     Objective:  BP 131/81   Pulse 64   Ht 6' (1.829 m)   Wt 145 lb 3.2 oz (65.9 kg)   SpO2 100%   BMI 19.69 kg/m      07/14/2023    9:18 AM 06/25/2023   11:07 AM 03/10/2023    9:09 AM  BP/Weight  Systolic BP 131 149 123  Diastolic BP 81 97 85  Wt. (Lbs) 145.2 140.8 136.8  BMI 19.69 kg/m2 19.1 kg/m2 18.55 kg/m2      Physical Exam Constitutional:      Appearance: He is well-developed.  Cardiovascular:     Rate and Rhythm: Normal rate.     Heart sounds: Normal heart sounds. No murmur heard. Pulmonary:     Effort: Pulmonary effort is normal.     Breath sounds: Normal breath sounds. No wheezing or rales.  Chest:     Chest wall: No tenderness.  Abdominal:     General: Bowel sounds are normal. There is no distension.     Palpations: Abdomen is soft. There is no mass.     Tenderness: There is no abdominal tenderness.  Musculoskeletal:        General: Normal range of motion.     Right lower leg: No edema.      Left lower leg: No edema.  Neurological:     Mental Status: He is alert. Mental status is at baseline.  Psychiatric:        Mood and Affect: Mood normal.        Latest Ref Rng & Units 03/10/2023   10:06 AM 07/15/2022    9:44 AM 03/31/2021    1:29 PM  CMP  Glucose 70 - 99 mg/dL 63  75  784   BUN 6 - 24 mg/dL 19  21  18    Creatinine 0.76 - 1.27 mg/dL 6.96  2.95  1.02   Sodium 134 - 144 mmol/L 147  142  137   Potassium 3.5 - 5.2 mmol/L 4.9  4.5  3.9   Chloride 96 - 106 mmol/L 108  102  99   CO2 20 - 29 mmol/L 20  25  28    Calcium 8.7 - 10.2 mg/dL 9.6  9.8  9.5   Total Protein 6.0 - 8.5 g/dL 7.0  7.4    Total Bilirubin 0.0 - 1.2 mg/dL 0.4  0.4    Alkaline Phos 44 - 121 IU/L 88  94    AST 0 - 40 IU/L 30  17    ALT 0 - 44 IU/L 20  15      Lipid Panel     Component Value Date/Time   CHOL 186 03/10/2023 1006   TRIG 72 03/10/2023 1006   HDL 77 03/10/2023 1006   CHOLHDL 2.4 03/10/2023 1006   LDLCALC 96 03/10/2023 1006    CBC    Component Value Date/Time   WBC 5.1 07/15/2022 0944   WBC 13.3 (H) 03/31/2021 1329   RBC 5.02 07/15/2022 0944   RBC 4.52 03/31/2021 1329   HGB 14.6 07/15/2022 0944   HCT 43.8 07/15/2022 0944   PLT 223 07/15/2022 0944   MCV 87 07/15/2022 0944   MCH 29.1 07/15/2022 0944   MCH 29.0 03/31/2021 1329   MCHC 33.3 07/15/2022 0944   MCHC 32.5 03/31/2021 1329   RDW 14.2 07/15/2022 0944   LYMPHSABS 1.0 07/15/2022 0944   MONOABS 1.4 (H) 03/31/2021 1329   EOSABS 0.1 07/15/2022 0944   BASOSABS 0.0 07/15/2022 0944    Lab Results  Component Value Date   HGBA1C 6.1 (H) 03/10/2023    Assessment & Plan:   Assessment & Plan Hyperlipidemia On Atorvastatin with no reported issues. -Continue Atorvastatin. -Lipid panel order  Behavioral Concerns Reports of sporadic moods and aggressive behavior. Currently on Clonidine and Zoloft, but aunt reports no improvement and possible worsening of symptoms. -Coordinate with behavioral health for re-evaluation  of current therapy. LCSW called in to see the patient for care coordination -Ensure regular appointments with a therapist are scheduled.  Prediabetes No current symptoms reported. Patient works at [employer] and reportedly consumes a lot of sweets and sodas. -Encourage healthier dietary choices. -Order A1C to monitor glucose levels.  Colon Cancer Screening Patient has a stool card at home from two months ago that has not been used. -Order Cologuard test to be sent to patient's home. Instruct patient to complete and return as per instructions.  General Health Maintenance -Consult with case manager and social worker to address concerns raised by aunt regarding patient's daily living habits and lack of self-care. -Plan to follow up on results of blood work.      Meds ordered this encounter  Medications   atorvastatin (LIPITOR) 10 MG tablet    Sig: Take 1 tablet (10 mg total) by mouth daily.    Dispense:  90 tablet    Refill:  1    Follow-up: Return in about 6 months (around 01/11/2024) for Chronic medical conditions.       Hoy Register, MD, FAAFP. [facility] [place], [place] [phone]   07/14/2023, 1:01 PM

## 2023-07-14 NOTE — Patient Instructions (Signed)
 VISIT SUMMARY:  Brent [name] visited today with his aunt, Layth Cerezo, to discuss his high cholesterol, behavioral concerns, pre-diabetes, and colon cancer screening. He is currently on medications for high cholesterol and behavioral issues, but his aunt reports concerns about their effectiveness and his diet. We discussed plans to address these issues and ensure proper follow-up.  YOUR PLAN:  -HYPERLIPIDEMIA: Hyperlipidemia means having high levels of cholesterol in the blood, which can increase the risk of heart disease. Brent [name] is currently taking atorvastatin to manage this condition, and he should continue with this medication as prescribed.  -BEHAVIORAL CONCERNS: Brent [name] has been experiencing mood swings and aggressive behavior. He is on Clonidine and Zoloft, but his aunt reports no improvement. We will coordinate with behavioral health for a re-evaluation of his current therapy and ensure he has regular appointments with a therapist.  -PREDIABETES: Prediabetes means that blood sugar levels are higher than normal but not yet high enough to be diagnosed as diabetes. Brent [name] should make healthier dietary choices, especially reducing sweets and sodas. We will also order blood work to monitor his glucose levels.  -COLON CANCER SCREENING: Colon cancer screening helps detect early signs of colon cancer. Brent [name] has a stool card at home that he has not used. We will send a Cologuard test to his home and he should complete and return it as per the instructions.  -GENERAL HEALTH MAINTENANCE: We will consult with a case manager and social worker to address concerns about Brent [name]'s daily living habits and self-care. We will also follow up on the results of his blood work.  INSTRUCTIONS:  Please continue taking atorvastatin as prescribed. We will coordinate with behavioral health for a re-evaluation of your current therapy and ensure regular appointments with a therapist. Make healthier dietary choices,  especially reducing sweets and sodas. Complete the Cologuard test when it arrives and return it as per the instructions. We will follow up on the results of your blood work.

## 2023-07-15 LAB — CMP14+EGFR
ALT: 17 IU/L (ref 0–44)
AST: 18 IU/L (ref 0–40)
Albumin: 4.5 g/dL (ref 4.1–5.1)
Alkaline Phosphatase: 85 IU/L (ref 44–121)
BUN/Creatinine Ratio: 22 — ABNORMAL HIGH (ref 9–20)
BUN: 19 mg/dL (ref 6–24)
Bilirubin Total: 0.5 mg/dL (ref 0.0–1.2)
CO2: 23 mmol/L (ref 20–29)
Calcium: 9.2 mg/dL (ref 8.7–10.2)
Chloride: 105 mmol/L (ref 96–106)
Creatinine, Ser: 0.88 mg/dL (ref 0.76–1.27)
Globulin, Total: 2.6 g/dL (ref 1.5–4.5)
Glucose: 78 mg/dL (ref 70–99)
Potassium: 4.6 mmol/L (ref 3.5–5.2)
Sodium: 143 mmol/L (ref 134–144)
Total Protein: 7.1 g/dL (ref 6.0–8.5)
eGFR: 107 mL/min/{1.73_m2} (ref >59)

## 2023-07-15 LAB — HEMOGLOBIN A1C
Est. average glucose Bld gHb Est-mCnc: 117 mg/dL
Hgb A1c MFr Bld: 5.7 % — ABNORMAL HIGH (ref 4.8–5.6)

## 2023-07-15 LAB — LP+NON-HDL CHOLESTEROL
Cholesterol, Total: 198 mg/dL (ref 100–199)
HDL: 79 mg/dL (ref >39)
LDL Chol Calc (NIH): 109 mg/dL — ABNORMAL HIGH (ref 0–99)
Total Non-HDL-Chol (LDL+VLDL): 119 mg/dL (ref 0–129)
Triglycerides: 56 mg/dL (ref 0–149)
VLDL Cholesterol Cal: 10 mg/dL (ref 5–40)

## 2023-07-23 ENCOUNTER — Encounter (HOSPITAL_COMMUNITY): Payer: MEDICAID | Admitting: Student

## 2023-09-30 ENCOUNTER — Other Ambulatory Visit: Payer: Self-pay

## 2023-10-01 ENCOUNTER — Other Ambulatory Visit: Payer: Self-pay

## 2023-10-04 IMAGING — DX DG CHEST 2V
2 series · 2 of 2 positions shown · non-contrast
Comparison: Chest radiograph 05/16/2009

CLINICAL DATA: Shortness of breath

EXAM:
CHEST - 2 VIEW

[chest pa]
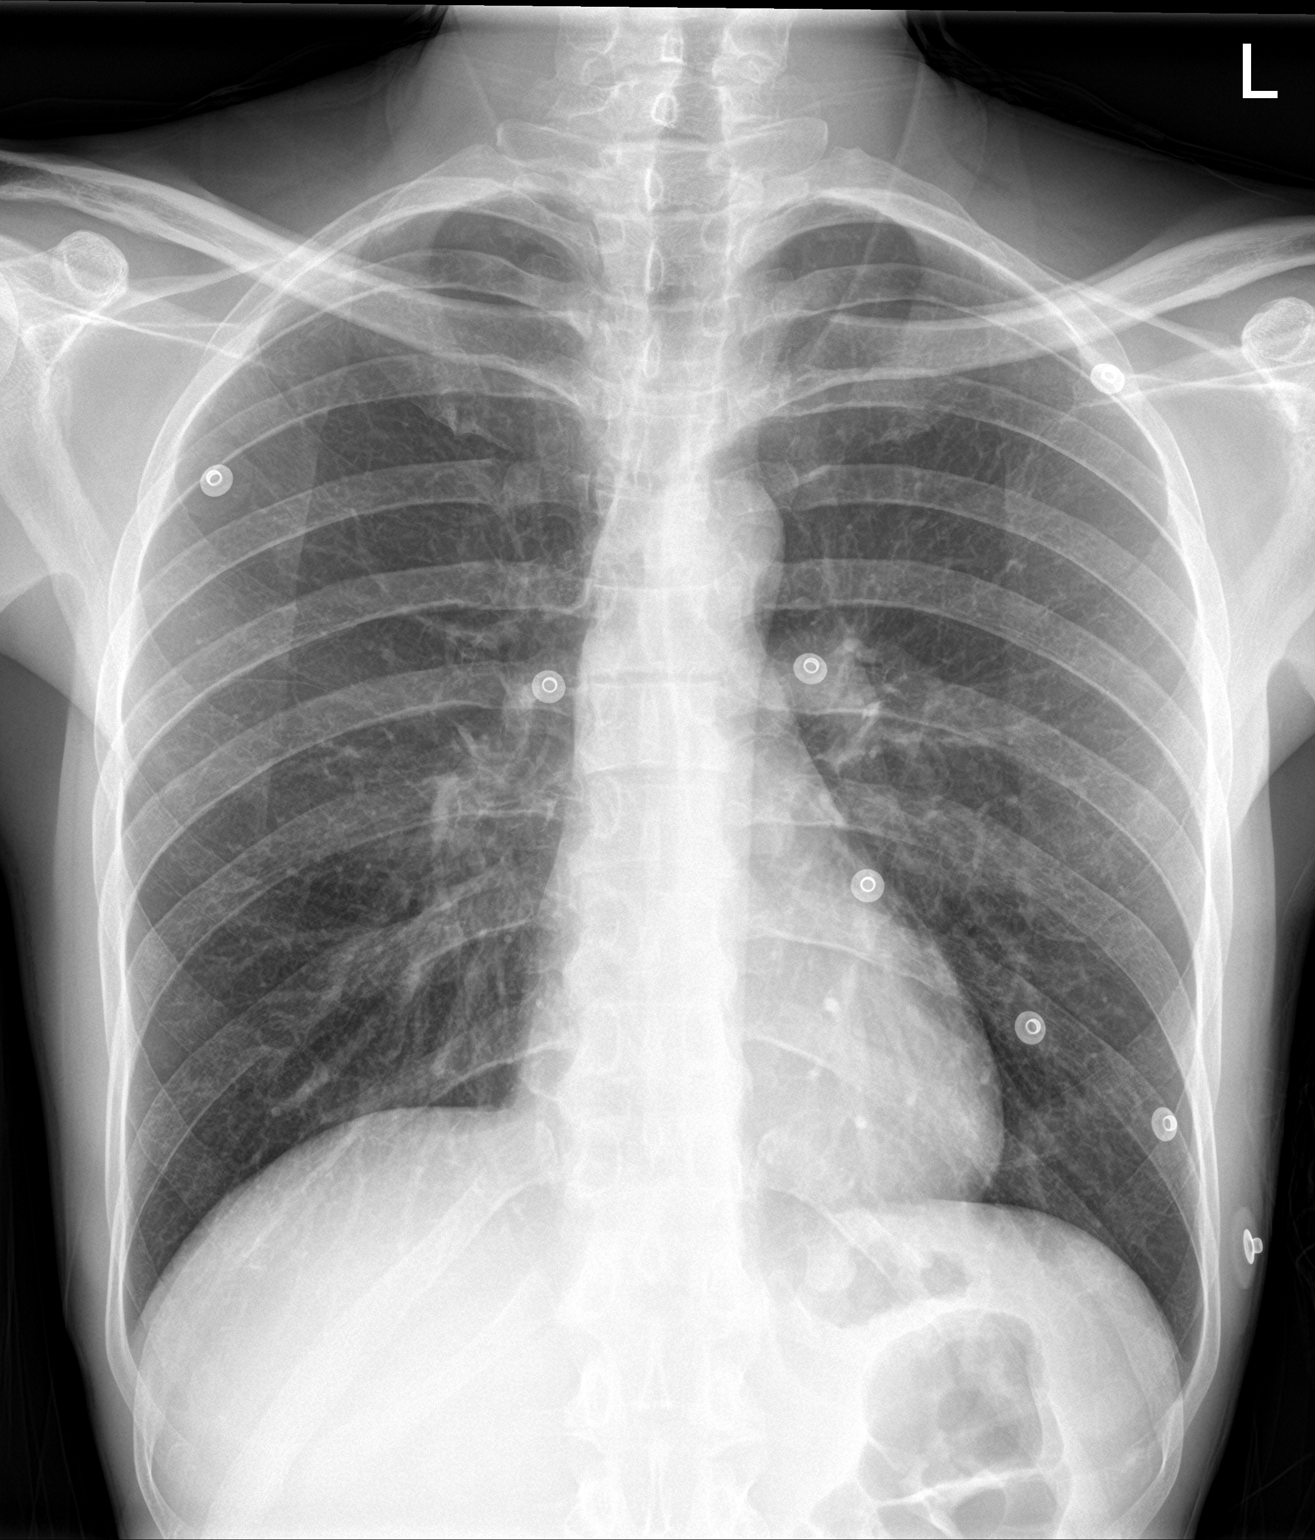

[chest lat]
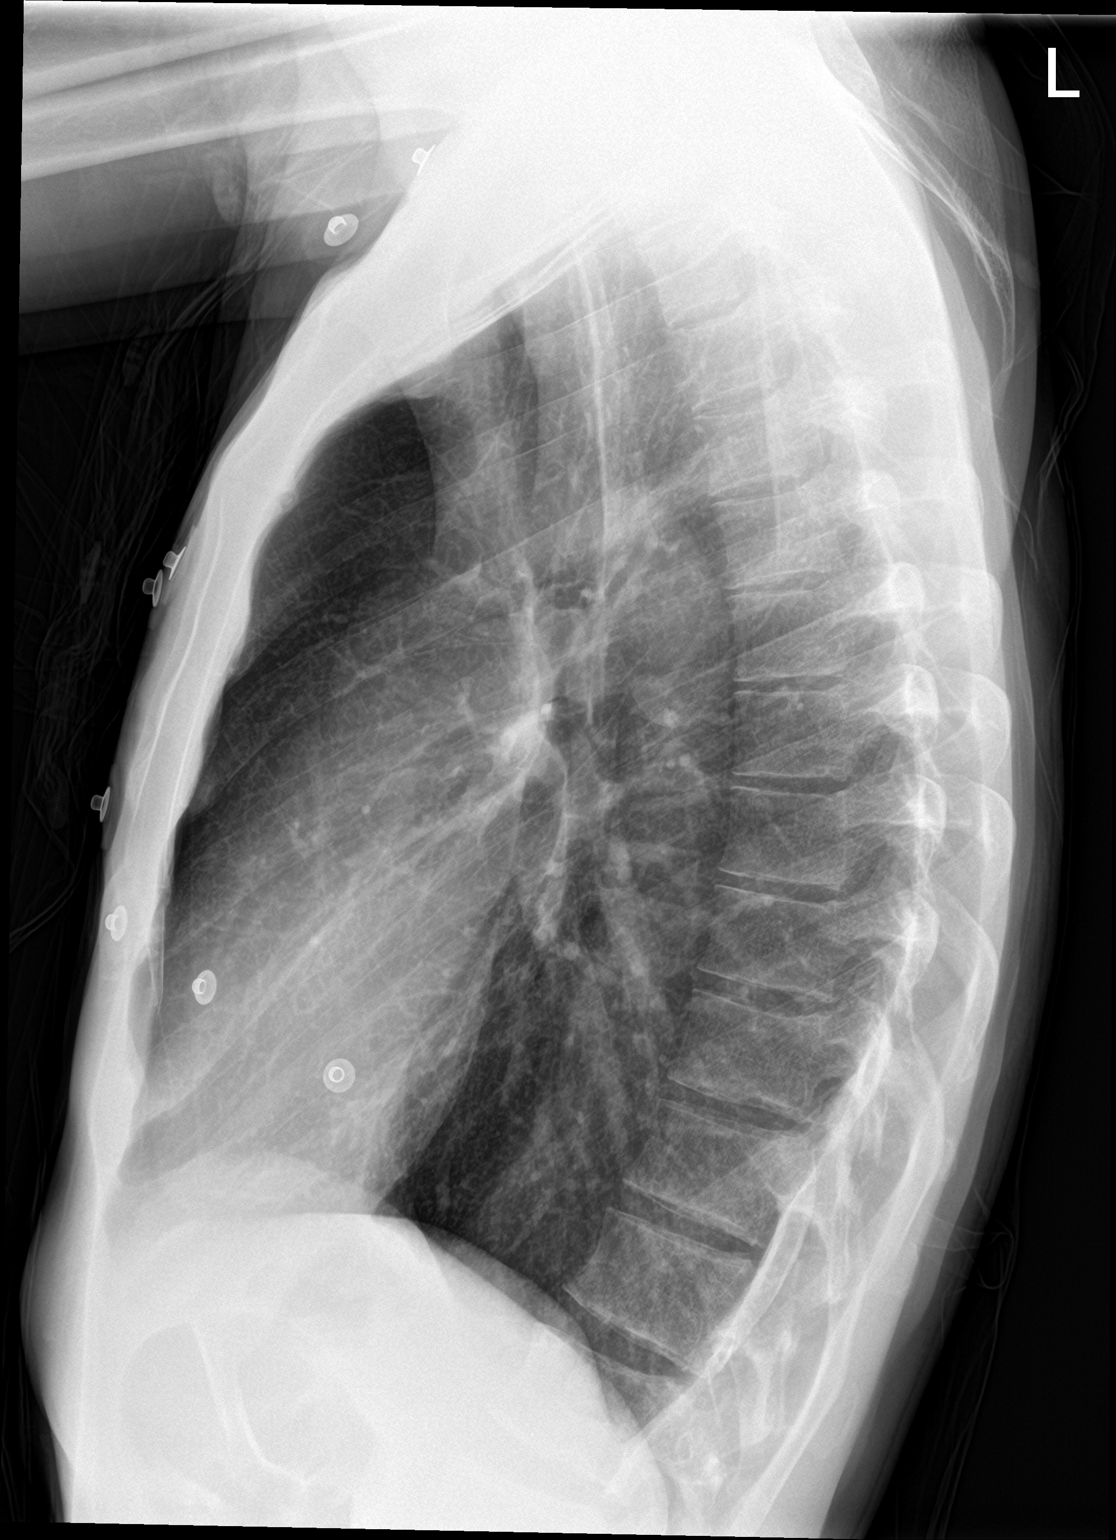

[2 of 2 positions shown; findings below may reference images not displayed]

FINDINGS: The cardiomediastinal silhouette is normal.

There is no focal consolidation or pulmonary edema. There is no
pleural effusion or pneumothorax.

The bones are unremarkable.
IMPRESSION: No radiographic evidence of acute cardiopulmonary process.

## 2023-11-12 ENCOUNTER — Other Ambulatory Visit: Payer: Self-pay | Admitting: Family Medicine

## 2023-11-12 ENCOUNTER — Other Ambulatory Visit (HOSPITAL_COMMUNITY): Payer: Self-pay | Admitting: Psychiatry

## 2023-11-12 ENCOUNTER — Other Ambulatory Visit (HOSPITAL_BASED_OUTPATIENT_CLINIC_OR_DEPARTMENT_OTHER): Payer: Self-pay

## 2023-11-12 ENCOUNTER — Other Ambulatory Visit: Payer: Self-pay | Admitting: Critical Care Medicine

## 2023-11-12 DIAGNOSIS — F09 Unspecified mental disorder due to known physiological condition: Secondary | ICD-10-CM

## 2023-11-12 DIAGNOSIS — E782 Mixed hyperlipidemia: Secondary | ICD-10-CM

## 2023-11-12 DIAGNOSIS — F639 Impulse disorder, unspecified: Secondary | ICD-10-CM

## 2023-11-12 MED ORDER — ATORVASTATIN CALCIUM 10 MG PO TABS
10.0000 mg | ORAL_TABLET | Freq: Every day | ORAL | 1 refills | Status: AC
  Filled 2023-11-12 – 2024-02-07 (×2): qty 90, 90d supply, fill #0
  Filled 2024-04-27: qty 90, 90d supply, fill #1

## 2023-11-12 MED ORDER — CETIRIZINE HCL 10 MG PO TABS
10.0000 mg | ORAL_TABLET | Freq: Every day | ORAL | 2 refills | Status: DC
Start: 2023-11-12 — End: 2024-03-10
  Filled 2023-11-12 – 2023-11-18 (×3): qty 30, 30d supply, fill #0
  Filled 2023-12-27: qty 30, 30d supply, fill #1
  Filled 2024-02-07 (×2): qty 30, 30d supply, fill #2

## 2023-11-15 ENCOUNTER — Other Ambulatory Visit (HOSPITAL_BASED_OUTPATIENT_CLINIC_OR_DEPARTMENT_OTHER): Payer: Self-pay

## 2023-11-18 ENCOUNTER — Other Ambulatory Visit (HOSPITAL_BASED_OUTPATIENT_CLINIC_OR_DEPARTMENT_OTHER): Payer: Self-pay

## 2023-11-18 ENCOUNTER — Encounter (HOSPITAL_COMMUNITY): Payer: MEDICAID | Admitting: Physician Assistant

## 2023-11-18 ENCOUNTER — Ambulatory Visit: Payer: Self-pay

## 2023-11-18 ENCOUNTER — Ambulatory Visit (HOSPITAL_COMMUNITY): Payer: Self-pay

## 2023-11-18 ENCOUNTER — Encounter (HOSPITAL_COMMUNITY): Payer: Self-pay | Admitting: Emergency Medicine

## 2023-11-18 ENCOUNTER — Other Ambulatory Visit: Payer: Self-pay

## 2023-11-18 ENCOUNTER — Ambulatory Visit (INDEPENDENT_AMBULATORY_CARE_PROVIDER_SITE_OTHER): Payer: MEDICAID | Admitting: Physician Assistant

## 2023-11-18 ENCOUNTER — Ambulatory Visit (HOSPITAL_COMMUNITY)
Admission: EM | Admit: 2023-11-18 | Discharge: 2023-11-18 | Disposition: A | Discharge status: Home / Self Care | Payer: MEDICAID | Attending: Family Medicine | Admitting: Family Medicine

## 2023-11-18 VITALS — BP 123/90 | HR 73 | Temp 97.6°F | Ht 72.0 in | Wt 136.2 lb

## 2023-11-18 DIAGNOSIS — F89 Unspecified disorder of psychological development: Secondary | ICD-10-CM | POA: Diagnosis not present

## 2023-11-18 DIAGNOSIS — Z758 Other problems related to medical facilities and other health care: Secondary | ICD-10-CM

## 2023-11-18 DIAGNOSIS — F69 Unspecified disorder of adult personality and behavior: Secondary | ICD-10-CM | POA: Diagnosis not present

## 2023-11-18 DIAGNOSIS — F639 Impulse disorder, unspecified: Secondary | ICD-10-CM

## 2023-11-18 DIAGNOSIS — R7303 Prediabetes: Secondary | ICD-10-CM | POA: Diagnosis not present

## 2023-11-18 DIAGNOSIS — F09 Unspecified mental disorder due to known physiological condition: Secondary | ICD-10-CM | POA: Insufficient documentation

## 2023-11-18 DIAGNOSIS — F88 Other disorders of psychological development: Secondary | ICD-10-CM | POA: Insufficient documentation

## 2023-11-18 DIAGNOSIS — F419 Anxiety disorder, unspecified: Secondary | ICD-10-CM

## 2023-11-18 DIAGNOSIS — F32A Depression, unspecified: Secondary | ICD-10-CM

## 2023-11-18 LAB — CBC
HCT: 43.6 % (ref 39.0–52.0)
Hemoglobin: 14.4 g/dL (ref 13.0–17.0)
MCH: 29.4 pg (ref 26.0–34.0)
MCHC: 33 g/dL (ref 30.0–36.0)
MCV: 89.2 fL (ref 80.0–100.0)
Platelets: 182 10*3/uL (ref 150–400)
RBC: 4.89 MIL/uL (ref 4.22–5.81)
RDW: 13 % (ref 11.5–15.5)
WBC: 4.7 10*3/uL (ref 4.0–10.5)
nRBC: 0 % (ref 0.0–0.2)

## 2023-11-18 LAB — COMPREHENSIVE METABOLIC PANEL WITH GFR
ALT: 15 U/L (ref 0–44)
AST: 19 U/L (ref 15–41)
Albumin: 4.3 g/dL (ref 3.5–5.0)
Alkaline Phosphatase: 61 U/L (ref 38–126)
Anion gap: 8 (ref 5–15)
BUN: 18 mg/dL (ref 6–20)
CO2: 27 mmol/L (ref 22–32)
Calcium: 9.4 mg/dL (ref 8.9–10.3)
Chloride: 104 mmol/L (ref 98–111)
Creatinine, Ser: 0.91 mg/dL (ref 0.61–1.24)
GFR, Estimated: >60 mL/min (ref >60)
Glucose, Bld: 92 mg/dL (ref 70–99)
Potassium: 4 mmol/L (ref 3.5–5.1)
Sodium: 139 mmol/L (ref 135–145)
Total Bilirubin: 0.8 mg/dL (ref 0.0–1.2)
Total Protein: 7.3 g/dL (ref 6.5–8.1)

## 2023-11-18 LAB — POCT URINALYSIS DIP (MANUAL ENTRY)
Bilirubin, UA: NEGATIVE
Blood, UA: NEGATIVE
Glucose, UA: NEGATIVE mg/dL
Leukocytes, UA: NEGATIVE
Nitrite, UA: NEGATIVE
Protein Ur, POC: NEGATIVE mg/dL
Spec Grav, UA: 1.02 (ref 1.010–1.025)
Urobilinogen, UA: 0.2 U/dL
pH, UA: 6.5 (ref 5.0–8.0)

## 2023-11-18 LAB — POCT FASTING CBG KUC MANUAL ENTRY: POCT Glucose (KUC): 79 mg/dL — AB (ref 70–99)

## 2023-11-18 MED ORDER — SERTRALINE HCL 50 MG PO TABS
50.0000 mg | ORAL_TABLET | Freq: Every day | ORAL | 1 refills | Status: DC
  Filled 2023-11-18: qty 30, 30d supply, fill #0
  Filled 2023-12-27: qty 30, 30d supply, fill #1

## 2023-11-18 MED ORDER — CLONIDINE HCL 0.2 MG PO TABS
0.2000 mg | ORAL_TABLET | Freq: Every day | ORAL | 1 refills | Status: DC
  Filled 2023-11-18: qty 30, 30d supply, fill #0
  Filled 2023-12-27: qty 30, 30d supply, fill #1

## 2023-11-18 NOTE — Discharge Instructions (Addendum)
 Slow changes in mental status changes but currently evaluation is reassuring that there is not an acute process. Urinalysis done today is negative for infection. Blood sugar normal today. Blood work drawn today including complete metabolic panel and complete blood count. These will be available in 24-28 hours. If anything needs further treatment we will contact you. Keep follow up appointment with internal medicine doctor as scheduled on 12/09/2023. Neurology referral made today by behavioral health. If you feel symptoms are worsening, then go to emergency department for further evaluation.

## 2023-11-18 NOTE — ED Provider Notes (Signed)
 [facility]    CSN: [id number] Arrival date & time: 11/18/23  1048      History   Chief Complaint No chief complaint on file.   HPI Brent [name] is a 48 y.o. male.   48 year old male with cognitive and neurobehavioral dysfunction as well as developmental delays is brought to urgent care by his aunt who is his guardian secondary to slow changes in his mental status.  This has been going on for over 7 months now.  There has not been any sudden change.  She did take him to his internal medicine doctor in February who did lab work on him which was all within relatively normal limits.  He was diagnosed with prediabetes at that time.  She has been managing his diet since then.  They did go to behavioral health today but was told there was not much they could do for him there and placed referrals for internal medicine follow-up as well as neurology follow-up.  He denies any fevers, abdominal pain, headaches, dysuria, hematuria.  He is working at [employer] and seems to be functioning well with this.  His aunt reports that he seems to be having increasing difficulty with his activities of daily living such as oral hygiene and personal hygiene.  She reports that sometimes he seems like he just does not hear her when she is talking to him.     Past Medical History:  Diagnosis Date   Anxiety    Depression    Hepatitis B    Hyperlipidemia 07/16/2022    Patient Active Problem List   Diagnosis Date Noted   Unspecified mood (affective) disorder (HCC) 07/31/2022   Developmental disorder 07/31/2022   Hyperlipidemia 07/16/2022   Stage 1 presymptomatic normoglycemic type 1 prediabetes 07/15/2022   History of dental problems 02/12/2022   Cognitive and neurobehavioral dysfunction 01/30/2021   Anxiety and depression 01/30/2021    Past Surgical History:  Procedure Laterality Date   NO PAST SURGERIES         Home Medications    Prior to Admission medications   Medication  Sig Start Date End Date Taking? Authorizing Provider  atorvastatin  (LIPITOR) 10 MG tablet Take 1 tablet (10 mg total) by mouth daily. 11/12/23  Yes Newlin, Enobong, MD  cetirizine  (ZYRTEC  ALLERGY) 10 MG tablet Take 1 tablet (10 mg total) by mouth daily. 11/12/23  Yes Newlin, Enobong, MD  cloNIDine  (CATAPRES ) 0.2 MG tablet Take 1 tablet (0.2 mg total) by mouth daily. 11/18/23  Yes Nwoko, Uchenna E, PA  Multiple Vitamin (MULTIVITAMIN ADULT) TABS Take 1 tablet by mouth daily.   Yes [provider]  sertraline  (ZOLOFT ) 50 MG tablet Take 1 tablet (50 mg total) by mouth daily. 11/18/23  Yes Nwoko, Reginia BRAVO, PA    Family History Family History  Problem Relation Age of Onset   Diabetes Father    Hypertension Father    Ovarian cancer Paternal Grandmother    Prostate cancer Paternal Grandfather     Social History Social History   Tobacco Use   Smoking status: Never   Smokeless tobacco: Never  Vaping Use   Vaping status: Never Used  Substance Use Topics   Alcohol use: Never   Drug use: No     Allergies   Patient has no known allergies.   Review of Systems Review of Systems   Physical Exam Triage Vital Signs ED Triage Vitals  Encounter Vitals Group     BP 11/18/23 1100 (!) 125/91  Girls Systolic BP Percentile --      Girls Diastolic BP Percentile --      Boys Systolic BP Percentile --      Boys Diastolic BP Percentile --      Pulse Rate 11/18/23 1100 73     Resp 11/18/23 1100 (!) 22     Temp 11/18/23 1100 98.1 F (36.7 C)     Temp Source 11/18/23 1100 Oral     SpO2 11/18/23 1100 98 %     Weight --      Height --      Head Circumference --      Peak Flow --      Pain Score 11/18/23 1057 0     Pain Loc --      Pain Education --      Exclude from Growth Chart --    No data found.  Updated Vital Signs BP (!) 125/91 (BP Location: Right Arm)   Pulse 73   Temp 98.1 F (36.7 C) (Oral)   Resp (!) 22   SpO2 98%   Visual Acuity Right Eye Distance:   Left Eye  Distance:   Bilateral Distance:    Right Eye Near:   Left Eye Near:    Bilateral Near:     Physical Exam   UC Treatments / Results  Labs (all labs ordered are listed, but only abnormal results are displayed) Labs Reviewed  POCT URINALYSIS DIP (MANUAL ENTRY) - Abnormal; Notable for the following components:      Result Value   Ketones, POC UA small (15) (*)    All other components within normal limits  POCT FASTING CBG [facility] MANUAL ENTRY - Abnormal; Notable for the following components:   POCT Glucose ([facility]) 79 (*)    All other components within normal limits  CBC  COMPREHENSIVE METABOLIC PANEL WITH GFR    EKG   Radiology No results found.  Procedures Procedures (including critical care time)  Medications Ordered in UC Medications - No data to display  Initial Impression / Assessment and Plan / UC Course  I have reviewed the triage vital signs and the nursing notes.  Pertinent labs & imaging results that were available during my care of the patient were reviewed by me and considered in my medical decision making (see chart for details).     Cognitive and neurobehavioral dysfunction  Developmental disorder  Behavior concern in adult   Slow changes in mental status changes but currently evaluation is reassuring that there is not an acute process. This has been going on for over 7 months and has not had any sudden change. Urinalysis done today is negative for infection. Blood sugar normal today. Blood work drawn today including complete metabolic panel and complete blood count. These will be available in 24-28 hours. If anything needs further treatment we will contact you. Keep follow up appointment with internal medicine doctor as scheduled on 12/09/2023. Neurology referral made today by behavioral health. If you feel symptoms are worsening, then go to emergency department for further evaluation.   Final Clinical Impressions(s) / UC Diagnoses   Final diagnoses:   Cognitive and neurobehavioral dysfunction  Developmental disorder  Behavior concern in adult     Discharge Instructions      Slow changes in mental status changes but currently evaluation is reassuring that there is not an acute process. Urinalysis done today is negative for infection. Blood sugar normal today. Blood work drawn today including complete metabolic panel and complete  blood count. These will be available in 24-28 hours. If anything needs further treatment we will contact you. Keep follow up appointment with internal medicine doctor as scheduled on 12/09/2023. Neurology referral made today by behavioral health. If you feel symptoms are worsening, then go to emergency department for further evaluation.     ED Prescriptions   None    PDMP not reviewed this encounter.   Teresa Almarie LABOR, PA-C 11/18/23 1258

## 2023-11-18 NOTE — ED Triage Notes (Signed)
 Aunt reports patient is acting strange.  Patient is not doing typical activities.  Patient is sitting and sleeping and aunt says this is not his normal.  Denies pain and has not admitted any thing to aunt.    Patient has not performed normal activities like brush teeth and not taking medicine.  This decrease in behavior is over a 3 month period.    This patient lives with aunt.   States she took him to behavioral health and said there was no one to help.  They are looking for someone to do neurological testing

## 2023-11-18 NOTE — Telephone Encounter (Signed)
 FYI Only or Action Required?: Action required by provider: request for appointment. [phone], please call Channing  Patient was last seen in primary care on 07/14/2023 by Delbert Clam, MD. Called Nurse Triage reporting Altered Mental Status. Symptoms began several months ago. Interventions attempted: Nothing. Symptoms are: gradually worsening.  Triage Disposition: See HCP Within 4 Hours (Or PCP Triage)  Patient/caregiver understands and will follow disposition?: Yes         Copied from CRM [id number]. Topic: Clinical - Red Word Triage >> Nov 18, 2023  7:43 AM Larissa RAMAN wrote: [facility] that prompted transfer to Nurse Triage: Confusion Reason for Disposition  [1] Longstanding confusion (e.g., dementia, stroke) AND [2] worsening  Answer Assessment - Initial Assessment Questions Channing reports pt has been more confused and seems to be less motivated or not understanding keeping up with basic tasks, she notes she does not know if he is medication compliant. No appt available, office not open, unable to call CAL       1. LEVEL OF CONSCIOUSNESS: How is he (she, the patient) acting right now? (e.g., alert-oriented, confused, lethargic, stuporous, comatose)     Stuporous, lack of motivation doesn't seem to understand instruction 2. ONSET: When did the confusion start?  (minutes, hours, days)     X 2-3 months 3. PATTERN Does this come and go, or has it been constant since it started?  Is it present now?     Constant 4. ALCOHOL or DRUGS: Has he been drinking alcohol or taking any drugs?      None 5. NARCOTIC MEDICINES: Has he been receiving any narcotic medications? (e.g., morphine, Vicodin)     None 6. CAUSE: What do you think is causing the confusion?      Unknown 7. OTHER SYMPTOMS: Are there any other symptoms? (e.g., difficulty breathing, headache, fever, weakness)     Patient not himself, needing appointment  Protocols used: Confusion - Delirium-A-AH

## 2023-11-18 NOTE — Telephone Encounter (Addendum)
 Recommended Mrs. Channing to take Mr. Meader to ED or UC for evaluation. Also advised to contact psychiatry.  She stated they asked normal questions and did not have anything else to recommend.   Informed there were no appointments available until later July. Scheduled appointment for 12/09/2023 at 0850.

## 2023-11-29 ENCOUNTER — Other Ambulatory Visit: Payer: Self-pay

## 2023-12-03 ENCOUNTER — Other Ambulatory Visit (HOSPITAL_BASED_OUTPATIENT_CLINIC_OR_DEPARTMENT_OTHER): Payer: Self-pay
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
## 2023-12-08 ENCOUNTER — Ambulatory Visit: Payer: MEDICAID | Admitting: Family Medicine

## 2023-12-09 ENCOUNTER — Encounter: Payer: Self-pay | Admitting: Critical Care Medicine

## 2023-12-09 ENCOUNTER — Ambulatory Visit: Payer: MEDICAID | Attending: Critical Care Medicine | Admitting: Critical Care Medicine

## 2023-12-09 VITALS — Resp 19 | Ht 72.0 in | Wt 137.6 lb

## 2023-12-09 DIAGNOSIS — F39 Unspecified mood [affective] disorder: Secondary | ICD-10-CM

## 2023-12-09 DIAGNOSIS — F89 Unspecified disorder of psychological development: Secondary | ICD-10-CM

## 2023-12-09 DIAGNOSIS — F09 Unspecified mental disorder due to known physiological condition: Secondary | ICD-10-CM | POA: Diagnosis not present

## 2023-12-09 DIAGNOSIS — F419 Anxiety disorder, unspecified: Secondary | ICD-10-CM | POA: Diagnosis not present

## 2023-12-09 DIAGNOSIS — E782 Mixed hyperlipidemia: Secondary | ICD-10-CM

## 2023-12-09 DIAGNOSIS — F32A Depression, unspecified: Secondary | ICD-10-CM

## 2023-12-09 DIAGNOSIS — E10A1 Type 1 diabetes mellitus, presymptomatic, stage 1: Secondary | ICD-10-CM

## 2023-12-09 NOTE — Assessment & Plan Note (Signed)
 Continue atorvastatin 

## 2023-12-09 NOTE — Patient Instructions (Signed)
 No change in medication  Referral to neurology psychiatry cognitive disorder clinic in [facility] will be made  Return for primary care 6 months

## 2023-12-09 NOTE — Assessment & Plan Note (Signed)
 Plan second opinion referral to the neurology psychiatry neurocognitive disorder clinic at [facility] and continue current medications management per psychiatry

## 2023-12-09 NOTE — Assessment & Plan Note (Signed)
Reassess labs 

## 2023-12-18 ENCOUNTER — Encounter (HOSPITAL_COMMUNITY): Payer: Self-pay | Admitting: Physician Assistant
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
## 2023-12-27 ENCOUNTER — Other Ambulatory Visit (HOSPITAL_COMMUNITY): Payer: Self-pay

## 2023-12-28 ENCOUNTER — Other Ambulatory Visit: Payer: Self-pay

## 2023-12-28 ENCOUNTER — Ambulatory Visit (INDEPENDENT_AMBULATORY_CARE_PROVIDER_SITE_OTHER): Payer: MEDICAID | Admitting: Physician Assistant

## 2023-12-28 ENCOUNTER — Other Ambulatory Visit (HOSPITAL_COMMUNITY): Payer: Self-pay

## 2023-12-28 VITALS — BP 126/97 | HR 78 | Temp 97.8°F | Ht 72.0 in | Wt 136.4 lb

## 2023-12-28 DIAGNOSIS — F419 Anxiety disorder, unspecified: Secondary | ICD-10-CM | POA: Diagnosis not present

## 2023-12-28 DIAGNOSIS — F639 Impulse disorder, unspecified: Secondary | ICD-10-CM | POA: Diagnosis not present

## 2023-12-28 DIAGNOSIS — F89 Unspecified disorder of psychological development: Secondary | ICD-10-CM | POA: Diagnosis not present

## 2023-12-28 DIAGNOSIS — F32A Depression, unspecified: Secondary | ICD-10-CM

## 2023-12-28 DIAGNOSIS — F09 Unspecified mental disorder due to known physiological condition: Secondary | ICD-10-CM | POA: Diagnosis not present

## 2023-12-28 MED ORDER — SERTRALINE HCL 50 MG PO TABS
50.0000 mg | ORAL_TABLET | Freq: Every day | ORAL | 2 refills | Status: DC
  Filled 2023-12-28 (×2): qty 30, 30d supply, fill #0
  Filled 2024-02-07 (×2): qty 30, 30d supply, fill #1
  Filled 2024-03-10: qty 30, 30d supply, fill #2

## 2023-12-28 MED ORDER — CLONIDINE HCL 0.2 MG PO TABS
0.2000 mg | ORAL_TABLET | Freq: Every day | ORAL | 2 refills | Status: DC
  Filled 2023-12-28 (×2): qty 30, 30d supply, fill #0
  Filled 2024-02-07 (×2): qty 30, 30d supply, fill #1
  Filled 2024-03-10: qty 30, 30d supply, fill #2

## 2023-12-29 ENCOUNTER — Other Ambulatory Visit: Payer: Self-pay

## 2024-01-08 ENCOUNTER — Encounter (HOSPITAL_COMMUNITY): Payer: Self-pay | Admitting: Physician Assistant
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
## 2024-02-07 ENCOUNTER — Other Ambulatory Visit: Payer: Self-pay

## 2024-02-07 ENCOUNTER — Other Ambulatory Visit (HOSPITAL_COMMUNITY): Payer: Self-pay

## 2024-02-08 ENCOUNTER — Encounter (HOSPITAL_COMMUNITY): Payer: MEDICAID | Admitting: Physician Assistant

## 2024-02-11 ENCOUNTER — Other Ambulatory Visit: Payer: Self-pay

## 2024-02-11 ENCOUNTER — Other Ambulatory Visit (HOSPITAL_COMMUNITY): Payer: Self-pay

## 2024-02-15 ENCOUNTER — Encounter (HOSPITAL_COMMUNITY): Payer: MEDICAID | Admitting: Physician Assistant

## 2024-03-10 ENCOUNTER — Other Ambulatory Visit (HOSPITAL_COMMUNITY): Payer: Self-pay

## 2024-03-10 ENCOUNTER — Other Ambulatory Visit: Payer: Self-pay

## 2024-03-10 ENCOUNTER — Other Ambulatory Visit: Payer: Self-pay | Admitting: Family Medicine

## 2024-03-10 MED ORDER — CETIRIZINE HCL 10 MG PO TABS
10.0000 mg | ORAL_TABLET | Freq: Every day | ORAL | 2 refills | Status: AC
  Filled 2024-03-10: qty 30, 30d supply, fill #0
  Filled 2024-04-27: qty 30, 30d supply, fill #1

## 2024-03-12 ENCOUNTER — Emergency Department
Admission: EM | Admit: 2024-03-12 | Discharge: 2024-03-12 | Disposition: A | Discharge status: Home / Self Care | Payer: Worker's Compensation

## 2024-03-12 ENCOUNTER — Emergency Department: Payer: Worker's Compensation

## 2024-03-12 ENCOUNTER — Other Ambulatory Visit: Payer: Self-pay

## 2024-03-12 DIAGNOSIS — S5002XA Contusion of left elbow, initial encounter: Secondary | ICD-10-CM | POA: Diagnosis present

## 2024-03-12 DIAGNOSIS — W000XXA Fall on same level due to ice and snow, initial encounter: Secondary | ICD-10-CM | POA: Diagnosis not present

## 2024-03-12 DIAGNOSIS — Y990 Civilian activity done for income or pay: Secondary | ICD-10-CM | POA: Diagnosis not present

## 2024-03-12 DIAGNOSIS — W19XXXA Unspecified fall, initial encounter: Secondary | ICD-10-CM

## 2024-03-12 MED ORDER — ACETAMINOPHEN 325 MG PO TABS
650.0000 mg | ORAL_TABLET | Freq: Once | ORAL | Status: AC
  Administered 2024-03-12: 650 mg via ORAL
  Filled 2024-03-12: qty 2

## 2024-03-12 NOTE — ED Provider Notes (Signed)
 [facility] Provider Note    Event Date/Time   First MD Initiated Contact with Patient 03/12/24 2024     (approximate)   History   Elbow Pain   HPI  Brent [name] is a 48 y.o. male history of hep B, anxiety, cognitive disorder presents emergency department after a fall at work.  Patient slipped on ice.  He works at [employer].  He fell and landed on the left elbow.  Denies head injury, back injury etc.  Denies numbness or tingling     Physical Exam   Triage Vital Signs: ED Triage Vitals  Encounter Vitals Group     BP 03/12/24 2006 (!) 144/95     Girls Systolic BP Percentile --      Girls Diastolic BP Percentile --      Boys Systolic BP Percentile --      Boys Diastolic BP Percentile --      Pulse Rate 03/12/24 2006 68     Resp 03/12/24 2006 16     Temp 03/12/24 2006 98.7 F (37.1 C)     Temp Source 03/12/24 2006 Oral     SpO2 03/12/24 2006 100 %     Weight 03/12/24 2007 144 lb (65.3 kg)     Height 03/12/24 2007 6' (1.829 m)     Head Circumference --      Peak Flow --      Pain Score 03/12/24 2007 8     Pain Loc --      Pain Education --      Exclude from Growth Chart --     Most recent vital signs: Vitals:   03/12/24 2006  BP: (!) 144/95  Pulse: 68  Resp: 16  Temp: 98.7 F (37.1 C)  SpO2: 100%     General: Awake, no distress.   CV:  Good peripheral perfusion.  Resp:  Normal effort. Abd:  No distention.   Other:  Left elbow minimally tender, no swelling noted, full range of motion, neurovascular   ED Results / Procedures / Treatments   Labs (all labs ordered are listed, but only abnormal results are displayed) Labs Reviewed - No data to display   EKG     RADIOLOGY X-ray left elbow    PROCEDURES:   Procedures  Critical Care: No Chief Complaint  Patient presents with   Elbow Pain      MEDICATIONS ORDERED IN ED: Medications  acetaminophen  (TYLENOL ) tablet 650 mg (650 mg Oral Given 03/12/24  2052)     IMPRESSION / MDM / ASSESSMENT AND PLAN / ED COURSE  I reviewed the triage vital signs and the nursing notes.                              Differential diagnosis includes, but is not limited to, contusion, sprain, fracture  Patient's presentation is most consistent with acute illness / injury with system symptoms.   Medications given: Tylenol  p.o.  X-ray left elbow, independently reviewed interpreted by me as being negative for any acute abnormality, confirmed by radiology  I did explain the findings to patient.  Is placed in a sling for comfort.  Work restrictions with decreased use of the left arm secondary to pain for 3 to 4 days.  Take Tylenol  for pain.  Use ice on the elbow.  He and his caregiver in agreement treatment plan.  Discharged stable condition.  FINAL CLINICAL IMPRESSION(S) / ED DIAGNOSES   Final diagnoses:  Fall, initial encounter  Contusion of left elbow, initial encounter     Rx / DC Orders   ED Discharge Orders     None        Note:  This document was prepared using Dragon voice recognition software and may include unintentional dictation errors.    Gasper Devere ORN, PA-C 03/12/24 2101    Nicholaus Rolland BRAVO, MD 03/13/24 [phone]

## 2024-03-12 NOTE — ED Triage Notes (Signed)
 Patient ambulatory to triage with complaints of falling at work around 4pm. Patient states he struck his left elbow and it is painful, no deformity.

## 2024-03-12 NOTE — Discharge Instructions (Signed)
 Follow-up with your regular doctor.  Follow-up with orthopedics if not better in 1 week.  Return emergency department worse

## 2024-03-13 ENCOUNTER — Other Ambulatory Visit: Payer: Self-pay

## 2024-03-24 ENCOUNTER — Ambulatory Visit (HOSPITAL_COMMUNITY): Payer: MEDICAID | Admitting: Physician Assistant

## 2024-03-24 ENCOUNTER — Other Ambulatory Visit: Payer: Self-pay

## 2024-03-24 ENCOUNTER — Encounter (HOSPITAL_COMMUNITY): Payer: Self-pay | Admitting: Physician Assistant

## 2024-03-24 VITALS — BP 109/77 | HR 103 | Temp 98.0°F | Ht 72.0 in | Wt 140.0 lb

## 2024-03-24 DIAGNOSIS — F419 Anxiety disorder, unspecified: Secondary | ICD-10-CM

## 2024-03-24 DIAGNOSIS — F32A Depression, unspecified: Secondary | ICD-10-CM

## 2024-03-24 DIAGNOSIS — F639 Impulse disorder, unspecified: Secondary | ICD-10-CM | POA: Diagnosis not present

## 2024-03-24 DIAGNOSIS — F09 Unspecified mental disorder due to known physiological condition: Secondary | ICD-10-CM | POA: Diagnosis not present

## 2024-03-24 MED ORDER — CLONIDINE HCL 0.2 MG PO TABS
0.2000 mg | ORAL_TABLET | Freq: Every day | ORAL | 2 refills | Status: DC
  Filled 2024-03-24 – 2024-04-27 (×2): qty 30, 30d supply, fill #0

## 2024-03-24 MED ORDER — SERTRALINE HCL 50 MG PO TABS
50.0000 mg | ORAL_TABLET | Freq: Every day | ORAL | 2 refills | Status: DC
  Filled 2024-03-24 – 2024-04-27 (×2): qty 30, 30d supply, fill #0

## 2024-04-27 ENCOUNTER — Other Ambulatory Visit: Payer: Self-pay

## 2024-05-03 ENCOUNTER — Encounter (HOSPITAL_COMMUNITY): Payer: MEDICAID | Admitting: Physician Assistant

## 2024-05-05 ENCOUNTER — Other Ambulatory Visit: Payer: Self-pay

## 2024-06-01 ENCOUNTER — Encounter (HOSPITAL_COMMUNITY): Payer: Self-pay | Admitting: Physician Assistant

## 2024-06-01 ENCOUNTER — Other Ambulatory Visit: Payer: Self-pay

## 2024-06-01 ENCOUNTER — Ambulatory Visit (HOSPITAL_COMMUNITY): Payer: MEDICAID | Admitting: Physician Assistant

## 2024-06-01 DIAGNOSIS — F32A Depression, unspecified: Secondary | ICD-10-CM | POA: Diagnosis not present

## 2024-06-01 DIAGNOSIS — F639 Impulse disorder, unspecified: Secondary | ICD-10-CM

## 2024-06-01 DIAGNOSIS — F419 Anxiety disorder, unspecified: Secondary | ICD-10-CM

## 2024-06-01 DIAGNOSIS — F09 Unspecified mental disorder due to known physiological condition: Secondary | ICD-10-CM | POA: Diagnosis not present

## 2024-06-01 MED ORDER — SERTRALINE HCL 50 MG PO TABS
50.0000 mg | ORAL_TABLET | Freq: Every day | ORAL | 2 refills | Status: AC
  Filled 2024-06-01: qty 30, 30d supply, fill #0

## 2024-06-01 MED ORDER — CLONIDINE HCL 0.2 MG PO TABS
0.2000 mg | ORAL_TABLET | Freq: Every day | ORAL | 2 refills | Status: AC
  Filled 2024-06-01: qty 30, 30d supply, fill #0
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
## 2024-06-08 ENCOUNTER — Other Ambulatory Visit: Payer: Self-pay

## 2024-06-09 ENCOUNTER — Telehealth: Payer: Self-pay | Admitting: Critical Care Medicine

## 2024-06-09 NOTE — Telephone Encounter (Signed)
 Contacted pt left vm to resch appt due to the weather on Monday

## 2024-06-12 ENCOUNTER — Ambulatory Visit: Payer: MEDICAID | Admitting: Family Medicine

## 2024-06-15 ENCOUNTER — Encounter (HOSPITAL_BASED_OUTPATIENT_CLINIC_OR_DEPARTMENT_OTHER): Payer: Self-pay

## 2024-06-15 ENCOUNTER — Emergency Department (HOSPITAL_BASED_OUTPATIENT_CLINIC_OR_DEPARTMENT_OTHER)
Admission: EM | Admit: 2024-06-15 | Discharge: 2024-06-15 | Disposition: A | Discharge status: Home / Self Care | Payer: MEDICAID | Attending: Emergency Medicine | Admitting: Emergency Medicine

## 2024-06-15 ENCOUNTER — Ambulatory Visit: Payer: Self-pay

## 2024-06-15 ENCOUNTER — Ambulatory Visit (HOSPITAL_COMMUNITY)
Admission: EM | Admit: 2024-06-15 | Discharge: 2024-06-15 | Disposition: A | Discharge status: Home / Self Care | Payer: MEDICAID

## 2024-06-15 ENCOUNTER — Emergency Department (HOSPITAL_BASED_OUTPATIENT_CLINIC_OR_DEPARTMENT_OTHER): Payer: MEDICAID

## 2024-06-15 ENCOUNTER — Other Ambulatory Visit: Payer: Self-pay

## 2024-06-15 ENCOUNTER — Encounter (HOSPITAL_COMMUNITY): Payer: Self-pay

## 2024-06-15 DIAGNOSIS — S0181XA Laceration without foreign body of other part of head, initial encounter: Secondary | ICD-10-CM

## 2024-06-15 DIAGNOSIS — W01198A Fall on same level from slipping, tripping and stumbling with subsequent striking against other object, initial encounter: Secondary | ICD-10-CM | POA: Diagnosis not present

## 2024-06-15 DIAGNOSIS — W19XXXA Unspecified fall, initial encounter: Secondary | ICD-10-CM

## 2024-06-15 DIAGNOSIS — W009XXA Unspecified fall due to ice and snow, initial encounter: Secondary | ICD-10-CM

## 2024-06-15 DIAGNOSIS — S01111A Laceration without foreign body of right eyelid and periocular area, initial encounter: Secondary | ICD-10-CM | POA: Diagnosis not present

## 2024-06-15 DIAGNOSIS — S60222A Contusion of left hand, initial encounter: Secondary | ICD-10-CM | POA: Diagnosis not present

## 2024-06-15 DIAGNOSIS — S0993XA Unspecified injury of face, initial encounter: Secondary | ICD-10-CM | POA: Diagnosis present

## 2024-06-15 MED ORDER — LIDOCAINE-EPINEPHRINE (PF) 2 %-1:200000 IJ SOLN
10.0000 mL | Freq: Once | INTRAMUSCULAR | Status: AC
  Administered 2024-06-15: 10 mL
  Filled 2024-06-15: qty 20

## 2024-06-15 NOTE — ED Triage Notes (Signed)
 Patient is being discharged from the Urgent Care and sent to the Emergency Department via POV . Per Leita Molly, PA, patient is in need of higher level of care due to eyelid/eyebrow laceration. Patient is aware and verbalizes understanding of plan of care.  Vitals:   06/15/24 1635  BP: 115/77  Pulse: 79  Resp: 16  Temp: 98.2 F (36.8 C)  SpO2: 98%

## 2024-06-15 NOTE — ED Triage Notes (Signed)
 Patient fell on ice today hitting his face on ice/concrete. Paatient has a laceration above the right eye.  Bleeding controlled at this time.  Patient denies taking any medication for his symptoms.

## 2024-06-15 NOTE — Discharge Instructions (Addendum)
-  Please head to [facility] Emergency Room for further evaluation. Most likely, they will close the wound. They may also do a scan of the face.

## 2024-06-15 NOTE — Discharge Instructions (Addendum)
 Can put some antibiotic ointment over the cut daily for the next 5 to 6 days.  You have 6 stitches that need to be taken out.  Your x-rays were normal today

## 2024-06-15 NOTE — ED Triage Notes (Signed)
 Pt c/o mechanical fall on ice, c/o periorbital pain, approx 3cm lac to R eyebrow. Bleeding controlled at time of triage, dressing changed.

## 2024-06-15 NOTE — Telephone Encounter (Signed)
 Noted.

## 2024-06-15 NOTE — ED Notes (Signed)
 DC paperwork given and verbally understood.

## 2024-06-15 NOTE — ED Provider Notes (Signed)
 " [facility]    CSN: [id number] Arrival date & time: 06/15/24  1610      History   Chief Complaint Chief Complaint  Patient presents with   Fall    HPI Brent [name] is a 49 y.o. male presenting facial pain and eyebrow laceration. History as below, including cognitive and neurobehavioral dysfunction developmental disorder. Accompanied by family member today.   Patient fell on ice today hitting his face on ice/concrete. Paatient has a laceration above the right eye.   Bleeding controlled at this time.   Patient denies taking any medication for his symptoms.  History primarily provided by family member.  HPI  Past Medical History:  Diagnosis Date   Anxiety    Depression    Hepatitis B    Hyperlipidemia 07/16/2022    Patient Active Problem List   Diagnosis Date Noted   Unspecified mood (affective) disorder 07/31/2022   Developmental disorder 07/31/2022   Hyperlipidemia 07/16/2022   Stage 1 presymptomatic normoglycemic type 1 diabetes mellitus 07/15/2022   History of dental problems 02/12/2022   Cognitive and neurobehavioral dysfunction 01/30/2021   Anxiety and depression 01/30/2021    Past Surgical History:  Procedure Laterality Date   NO PAST SURGERIES         Home Medications    Prior to Admission medications  Medication Sig Start Date End Date Taking? Authorizing Provider  atorvastatin  (LIPITOR) 10 MG tablet Take 1 tablet (10 mg total) by mouth daily. 11/12/23   Newlin, Enobong, MD  cetirizine  (ZYRTEC  ALLERGY) 10 MG tablet Take 1 tablet (10 mg total) by mouth daily. 03/10/24   Newlin, Enobong, MD  cloNIDine  (CATAPRES ) 0.2 MG tablet Take 1 tablet (0.2 mg total) by mouth daily. 06/01/24   Nwoko, Uchenna E, PA  Multiple Vitamin (MULTIVITAMIN ADULT) TABS Take 1 tablet by mouth daily.    [provider]  sertraline  (ZOLOFT ) 50 MG tablet Take 1 tablet (50 mg total) by mouth daily. 06/01/24   Collene Reginia BRAVO, PA    Family History Family  History  Problem Relation Age of Onset   Diabetes Father    Hypertension Father    Ovarian cancer Paternal Grandmother    Prostate cancer Paternal Grandfather     Social History Social History[1]   Allergies   Patient has no known allergies.   Review of Systems Review of Systems  Skin:  Positive for wound.     Physical Exam Triage Vital Signs ED Triage Vitals  Encounter Vitals Group     BP      Girls Systolic BP Percentile      Girls Diastolic BP Percentile      Boys Systolic BP Percentile      Boys Diastolic BP Percentile      Pulse      Resp      Temp      Temp src      SpO2      Weight      Height      Head Circumference      Peak Flow      Pain Score      Pain Loc      Pain Education      Exclude from Growth Chart    No data found.  Updated Vital Signs BP 115/77 (BP Location: Left Arm)   Pulse 79   Temp 98.2 F (36.8 C) (Oral)   Resp 16   SpO2 98%   Visual Acuity Right Eye Distance:  Left Eye Distance:   Bilateral Distance:    Right Eye Near:   Left Eye Near:    Bilateral Near:     Physical Exam Vitals reviewed.  Constitutional:      General: He is not in acute distress.    Appearance: Normal appearance. He is not ill-appearing.  HENT:     Head: Normocephalic and atraumatic.  Pulmonary:     Effort: Pulmonary effort is normal.  Skin:    Comments: See image below Upper outer orbit with 3 cm gaping laceration under the eyebrow with active bleeding and subcutaneous tissue visible.  There is orbital tenderness. EOMI, PERRLA. Visual acuity grossly intact.  Neurological:     General: No focal deficit present.     Mental Status: He is alert and oriented to person, place, and time.  Psychiatric:        Mood and Affect: Mood normal.        Behavior: Behavior normal.        Thought Content: Thought content normal.        Judgment: Judgment normal.       UC Treatments / Results  Labs (all labs ordered are listed, but only abnormal  results are displayed) Labs Reviewed - No data to display  EKG   Radiology No results found.  Procedures Procedures (including critical care time)  Medications Ordered in UC Medications - No data to display  Initial Impression / Assessment and Plan / UC Course  I have reviewed the triage vital signs and the nursing notes.  Pertinent labs & imaging results that were available during my care of the patient were reviewed by me and considered in my medical decision making (see chart for details).     Patient is a pleasant 49 y.o. male presenting with eyebrow laceration and orbital pain following fall onto face. Following discussion with caregiver they will proceed to ER for further evaluation. This patient may benefit from imaging of the orbit.  Final Clinical Impressions(s) / UC Diagnoses   Final diagnoses:  Facial laceration, initial encounter  Fall, initial encounter     Discharge Instructions      -Please head to [facility] Emergency Room for further evaluation. Most likely, they will close the wound. They may also do a scan of the face.      ED Prescriptions   None    PDMP not reviewed this encounter.     [1]  Social History Tobacco Use   Smoking status: Never   Smokeless tobacco: Never  Vaping Use   Vaping status: Never Used  Substance Use Topics   Alcohol use: Never   Drug use: No     Arlyss Leita BRAVO, PA-C 06/15/24 1659  "

## 2024-06-15 NOTE — ED Provider Notes (Signed)
 " [place] [facility] AT [facility] Provider Note   CSN: [id number] Arrival date & time: 06/15/24  1717     Patient presents with: Laceration (R eyebrow) and Fall   Brent [name] is a 49 y.o. male.   Patient is a 49 year old male with a history of hyperlipidemia, anxiety, speech delay who is presenting today after a slip and fall.  He reports he walked out on the sidewalk and slipped on ice falling forward hitting the right side of his face and eyebrow on the pavement.  He did not have any loss of consciousness denies any significant headache nausea or vomiting.  He was seen at urgent care who sent him here for further care.  He is not sure when his last tetanus shot was.  He does not take any anticoagulation.  His family confirm that he has otherwise been acting his normal self.  No change in his vision.  He denies any neck pain.  He is also having some pain in his left hand.  The history is provided by the patient and a relative.  Laceration Fall       Prior to Admission medications  Medication Sig Start Date End Date Taking? Authorizing Provider  atorvastatin  (LIPITOR) 10 MG tablet Take 1 tablet (10 mg total) by mouth daily. 11/12/23   Newlin, Enobong, MD  cetirizine  (ZYRTEC  ALLERGY) 10 MG tablet Take 1 tablet (10 mg total) by mouth daily. 03/10/24   Newlin, Enobong, MD  cloNIDine  (CATAPRES ) 0.2 MG tablet Take 1 tablet (0.2 mg total) by mouth daily. 06/01/24   Nwoko, Uchenna E, PA  Multiple Vitamin (MULTIVITAMIN ADULT) TABS Take 1 tablet by mouth daily.    [provider]  sertraline  (ZOLOFT ) 50 MG tablet Take 1 tablet (50 mg total) by mouth daily. 06/01/24   Nwoko, Uchenna E, PA    Allergies: Patient has no known allergies.    Review of Systems  Updated Vital Signs BP 102/76 (BP Location: Right Arm)   Pulse 62   Resp 16   SpO2 92%   Physical Exam Vitals and nursing note reviewed.  Constitutional:      General: He is not in acute  distress.    Appearance: He is well-developed.  HENT:     Head: Normocephalic and atraumatic.      Mouth/Throat:     Mouth: Mucous membranes are moist.  Eyes:     Conjunctiva/sclera: Conjunctivae normal.     Pupils: Pupils are equal, round, and reactive to light.  Cardiovascular:     Rate and Rhythm: Normal rate and regular rhythm.     Heart sounds: No murmur heard. Pulmonary:     Effort: Pulmonary effort is normal. No respiratory distress.     Breath sounds: Normal breath sounds. No wheezing or rales.  Abdominal:     General: There is no distension.     Palpations: Abdomen is soft.     Tenderness: There is no abdominal tenderness. There is no guarding or rebound.  Musculoskeletal:        General: Tenderness present. Normal range of motion.     Cervical back: Normal range of motion and neck supple. No tenderness.     Right lower leg: No edema.     Left lower leg: No edema.     Comments: Ecchymosis present over the dorsum of the left hand without significant tenderness.  Left wrist is normal.  Skin:    General: Skin is warm and dry.  Findings: No erythema or rash.  Neurological:     Mental Status: He is alert and oriented to person, place, and time. Mental status is at baseline.     Sensory: No sensory deficit.     Motor: No weakness.     Gait: Gait normal.  Psychiatric:        Behavior: Behavior normal.     (all labs ordered are listed, but only abnormal results are displayed) Labs Reviewed - No data to display  EKG: None  Radiology: CT Maxillofacial Wo Contrast Result Date: 06/15/2024 EXAM: CT OF THE FACE WITHOUT CONTRAST 06/15/2024 07:00:42 PM TECHNIQUE: CT of the face was performed without the administration of intravenous contrast. Multiplanar reformatted images are provided for review. Automated exposure control, iterative reconstruction, and/or weight based adjustment of the mA/kV was utilized to reduce the radiation dose to as low as reasonably achievable.  COMPARISON: None available. CLINICAL HISTORY: Recent fall with right periorbital pain, initial encounter. FINDINGS: FACIAL BONES: No Acute bony fracture is noted. Multiple dental caries are noted. No mandibular dislocation. No suspicious bone lesion. ORBITS: Globes are intact. No acute traumatic injury. No inflammatory change. SINUSES AND MASTOIDS: No acute abnormality. SOFT TISSUES: Laceration is noted along the lateral aspect of the right eyebrow. IMPRESSION: 1. No Acute bony fracture. 2. Laceration along the lateral aspect of the right eyebrow. Electronically signed by: Oneil Devonshire MD 06/15/2024 07:23 PM EST RP Workstation: [id number]     Procedures  LACERATION REPAIR Performed by: [facility] Authorized by: Benton Shone Consent: Verbal consent obtained. Risks and benefits: risks, benefits and alternatives were discussed Consent given by: patient Patient identity confirmed: provided demographic data Prepped and Draped in normal sterile fashion Wound explored  Laceration Location: right eyebrow  Laceration Length: 2cm  No Foreign Bodies seen or palpated  Anesthesia: local infiltration  Local anesthetic: lidocaine  2% with epinephrine   Anesthetic total: 3 ml  Irrigation method: syringe Amount of cleaning: standard  Skin closure: 6.0 prolene  Number of sutures: 6     Technique: simple interrupted  Patient tolerance: Patient tolerated the procedure well with no immediate complications.  Medications Ordered in the ED  lidocaine -EPINEPHrine  (XYLOCAINE  W/EPI) 2 %-1:200000 (PF) injection 10 mL (has no administration in time range)                                    Medical Decision Making Amount and/or Complexity of Data Reviewed Radiology: ordered and independent interpretation performed. Decision-making details documented in ED Course.  Risk Prescription drug management.   Pt  presenting today with a complaint that caries a high risk for morbidity and  mortality. After a fall with facial injury.  Patient does not take anticoagulation and is otherwise acting normally.  Last Tdap was 2022 and he is up-to-date. I have independently visualized and interpreted pt's images today.  Imaging negative for acute fracture.  Radiology reports laceration along the lateral aspect of the right eyebrow.  Wound repaired as above.  Patient stable for discharge.      Final diagnoses:  Facial laceration, initial encounter  Fall on ice    ED Discharge Orders     None          Shone Benton, MD 06/15/24 2103  "

## 2024-06-15 NOTE — Telephone Encounter (Signed)
 FYI Only or Action Required?: FYI only for provider: UC or ED.  Patient was last seen in primary care on 12/09/2023 by Brien Belvie BRAVO, MD.  Called Nurse Triage reporting Head Injury.  Symptoms began today.  Interventions attempted: Rest, hydration, or home remedies.  Symptoms are: unchanged.  Triage Disposition: Go to ED Now (Notify PCP)  Patient/caregiver understands and will follow disposition?: yes  Reason for Triage: Pt uncle called in stating that pt fell and split his head on ice. Kayla denied having any other symptoms. Warm transferred to nurse triage.   Pt slipped and fell on ice, no LOC. Per witness/caller, pt has 1x0.5 laceration above the eye. Denies blood thinners. Denies CMS changes.  Reason for Disposition  Skin is split open or gaping (or length > 1/2 inch or 12 mm)  Protocols used: Head Injury-A-AH

## 2024-07-13 ENCOUNTER — Encounter (HOSPITAL_COMMUNITY): Payer: MEDICAID | Admitting: Physician Assistant
